# Patient Record
Sex: Male | Born: 1980 | Race: Black or African American | Hispanic: No | Marital: Single | Smoking: Current some day smoker
Health system: Southern US, Community
[De-identification: ages and names within clinical notes are randomized; demographics above are authoritative.]

## PROBLEM LIST (undated history)

## (undated) DIAGNOSIS — F1010 Alcohol abuse, uncomplicated: Secondary | ICD-10-CM

## (undated) DIAGNOSIS — F1410 Cocaine abuse, uncomplicated: Secondary | ICD-10-CM

## (undated) DIAGNOSIS — Z720 Tobacco use: Secondary | ICD-10-CM

## (undated) DIAGNOSIS — G5631 Lesion of radial nerve, right upper limb: Secondary | ICD-10-CM

## (undated) DIAGNOSIS — G2581 Restless legs syndrome: Secondary | ICD-10-CM

---

## 2007-02-15 ENCOUNTER — Emergency Department (HOSPITAL_COMMUNITY): Admission: EM | Admit: 2007-02-15 | Discharge: 2007-02-15 | Payer: Self-pay | Admitting: Emergency Medicine

## 2020-06-19 DIAGNOSIS — T300 Burn of unspecified body region, unspecified degree: Secondary | ICD-10-CM

## 2020-06-19 HISTORY — DX: Burn of unspecified body region, unspecified degree: T30.0

## 2020-07-28 ENCOUNTER — Emergency Department (HOSPITAL_COMMUNITY): Payer: Medicaid Other

## 2020-07-28 ENCOUNTER — Encounter (HOSPITAL_COMMUNITY): Payer: Self-pay | Admitting: *Deleted

## 2020-07-28 ENCOUNTER — Inpatient Hospital Stay (HOSPITAL_COMMUNITY)
Admission: EM | Admit: 2020-07-28 | Discharge: 2020-08-14 | DRG: 917 | Disposition: A | Discharge status: Home / Self Care | Payer: Medicaid Other | Attending: Internal Medicine | Admitting: Internal Medicine

## 2020-07-28 DIAGNOSIS — K7110 Toxic liver disease with hepatic necrosis, without coma: Secondary | ICD-10-CM | POA: Diagnosis present

## 2020-07-28 DIAGNOSIS — K449 Diaphragmatic hernia without obstruction or gangrene: Secondary | ICD-10-CM | POA: Diagnosis not present

## 2020-07-28 DIAGNOSIS — D6959 Other secondary thrombocytopenia: Secondary | ICD-10-CM | POA: Diagnosis present

## 2020-07-28 DIAGNOSIS — E869 Volume depletion, unspecified: Secondary | ICD-10-CM | POA: Diagnosis present

## 2020-07-28 DIAGNOSIS — N179 Acute kidney failure, unspecified: Secondary | ICD-10-CM

## 2020-07-28 DIAGNOSIS — K226 Gastro-esophageal laceration-hemorrhage syndrome: Secondary | ICD-10-CM | POA: Diagnosis present

## 2020-07-28 DIAGNOSIS — Z20822 Contact with and (suspected) exposure to covid-19: Secondary | ICD-10-CM | POA: Diagnosis present

## 2020-07-28 DIAGNOSIS — K2950 Unspecified chronic gastritis without bleeding: Secondary | ICD-10-CM | POA: Diagnosis present

## 2020-07-28 DIAGNOSIS — I10 Essential (primary) hypertension: Secondary | ICD-10-CM | POA: Diagnosis not present

## 2020-07-28 DIAGNOSIS — E876 Hypokalemia: Secondary | ICD-10-CM | POA: Diagnosis not present

## 2020-07-28 DIAGNOSIS — T8241XA Breakdown (mechanical) of vascular dialysis catheter, initial encounter: Secondary | ICD-10-CM

## 2020-07-28 DIAGNOSIS — J9601 Acute respiratory failure with hypoxia: Secondary | ICD-10-CM | POA: Diagnosis not present

## 2020-07-28 DIAGNOSIS — F1010 Alcohol abuse, uncomplicated: Secondary | ICD-10-CM | POA: Diagnosis not present

## 2020-07-28 DIAGNOSIS — D689 Coagulation defect, unspecified: Secondary | ICD-10-CM | POA: Diagnosis present

## 2020-07-28 DIAGNOSIS — T391X1A Poisoning by 4-Aminophenol derivatives, accidental (unintentional), initial encounter: Principal | ICD-10-CM

## 2020-07-28 DIAGNOSIS — R7989 Other specified abnormal findings of blood chemistry: Secondary | ICD-10-CM

## 2020-07-28 DIAGNOSIS — F1410 Cocaine abuse, uncomplicated: Secondary | ICD-10-CM | POA: Diagnosis present

## 2020-07-28 DIAGNOSIS — Z09 Encounter for follow-up examination after completed treatment for conditions other than malignant neoplasm: Secondary | ICD-10-CM

## 2020-07-28 DIAGNOSIS — K5900 Constipation, unspecified: Secondary | ICD-10-CM | POA: Diagnosis not present

## 2020-07-28 DIAGNOSIS — R109 Unspecified abdominal pain: Secondary | ICD-10-CM

## 2020-07-28 DIAGNOSIS — G5631 Lesion of radial nerve, right upper limb: Secondary | ICD-10-CM | POA: Diagnosis present

## 2020-07-28 DIAGNOSIS — E871 Hypo-osmolality and hyponatremia: Secondary | ICD-10-CM | POA: Diagnosis present

## 2020-07-28 DIAGNOSIS — B9681 Helicobacter pylori [H. pylori] as the cause of diseases classified elsewhere: Secondary | ICD-10-CM | POA: Diagnosis present

## 2020-07-28 DIAGNOSIS — G9341 Metabolic encephalopathy: Secondary | ICD-10-CM | POA: Diagnosis present

## 2020-07-28 DIAGNOSIS — F17210 Nicotine dependence, cigarettes, uncomplicated: Secondary | ICD-10-CM | POA: Diagnosis not present

## 2020-07-28 DIAGNOSIS — F1210 Cannabis abuse, uncomplicated: Secondary | ICD-10-CM | POA: Diagnosis present

## 2020-07-28 DIAGNOSIS — K760 Fatty (change of) liver, not elsewhere classified: Secondary | ICD-10-CM | POA: Diagnosis present

## 2020-07-28 DIAGNOSIS — K529 Noninfective gastroenteritis and colitis, unspecified: Secondary | ICD-10-CM

## 2020-07-28 DIAGNOSIS — K7040 Alcoholic hepatic failure without coma: Secondary | ICD-10-CM | POA: Diagnosis present

## 2020-07-28 DIAGNOSIS — L299 Pruritus, unspecified: Secondary | ICD-10-CM | POA: Diagnosis not present

## 2020-07-28 DIAGNOSIS — E875 Hyperkalemia: Secondary | ICD-10-CM | POA: Diagnosis not present

## 2020-07-28 DIAGNOSIS — K7290 Hepatic failure, unspecified without coma: Secondary | ICD-10-CM | POA: Diagnosis present

## 2020-07-28 DIAGNOSIS — D619 Aplastic anemia, unspecified: Secondary | ICD-10-CM | POA: Diagnosis present

## 2020-07-28 DIAGNOSIS — T24312D Burn of third degree of left thigh, subsequent encounter: Secondary | ICD-10-CM

## 2020-07-28 DIAGNOSIS — X12XXXD Contact with other hot fluids, subsequent encounter: Secondary | ICD-10-CM | POA: Diagnosis present

## 2020-07-28 DIAGNOSIS — T310 Burns involving less than 10% of body surface: Secondary | ICD-10-CM | POA: Diagnosis present

## 2020-07-28 DIAGNOSIS — Z87442 Personal history of urinary calculi: Secondary | ICD-10-CM

## 2020-07-28 DIAGNOSIS — N170 Acute kidney failure with tubular necrosis: Secondary | ICD-10-CM | POA: Diagnosis present

## 2020-07-28 DIAGNOSIS — E872 Acidosis: Secondary | ICD-10-CM | POA: Diagnosis present

## 2020-07-28 DIAGNOSIS — K7200 Acute and subacute hepatic failure without coma: Secondary | ICD-10-CM

## 2020-07-28 DIAGNOSIS — R0600 Dyspnea, unspecified: Secondary | ICD-10-CM

## 2020-07-28 DIAGNOSIS — R52 Pain, unspecified: Secondary | ICD-10-CM

## 2020-07-28 DIAGNOSIS — G2581 Restless legs syndrome: Secondary | ICD-10-CM | POA: Diagnosis present

## 2020-07-28 DIAGNOSIS — R791 Abnormal coagulation profile: Secondary | ICD-10-CM

## 2020-07-28 HISTORY — DX: Tobacco use: Z72.0

## 2020-07-28 HISTORY — DX: Lesion of radial nerve, right upper limb: G56.31

## 2020-07-28 HISTORY — DX: Cocaine abuse, uncomplicated: F14.10

## 2020-07-28 HISTORY — DX: Restless legs syndrome: G25.81

## 2020-07-28 HISTORY — DX: Alcohol abuse, uncomplicated: F10.10

## 2020-07-28 LAB — PROTIME-INR
INR: 2.5 — ABNORMAL HIGH (ref 0.8–1.2)
Prothrombin Time: 25.8 seconds — ABNORMAL HIGH (ref 11.4–15.2)

## 2020-07-28 LAB — SARS CORONAVIRUS 2 BY RT PCR (HOSPITAL ORDER, PERFORMED IN ~~LOC~~ HOSPITAL LAB): SARS Coronavirus 2: NEGATIVE

## 2020-07-28 LAB — COMPREHENSIVE METABOLIC PANEL
ALT: 8319 U/L — ABNORMAL HIGH (ref 0–44)
AST: >10000 U/L — ABNORMAL HIGH (ref 15–41)
Albumin: 3.8 g/dL (ref 3.5–5.0)
Alkaline Phosphatase: 198 U/L — ABNORMAL HIGH (ref 38–126)
Anion gap: 15 (ref 5–15)
BUN: 14 mg/dL (ref 6–20)
CO2: 21 mmol/L — ABNORMAL LOW (ref 22–32)
Calcium: 9 mg/dL (ref 8.9–10.3)
Chloride: 98 mmol/L (ref 98–111)
Creatinine, Ser: 2.26 mg/dL — ABNORMAL HIGH (ref 0.61–1.24)
GFR calc Af Amer: 41 mL/min — ABNORMAL LOW (ref >60)
GFR calc non Af Amer: 35 mL/min — ABNORMAL LOW (ref >60)
Glucose, Bld: 118 mg/dL — ABNORMAL HIGH (ref 70–99)
Potassium: 4.1 mmol/L (ref 3.5–5.1)
Sodium: 134 mmol/L — ABNORMAL LOW (ref 135–145)
Total Bilirubin: 5.6 mg/dL — ABNORMAL HIGH (ref 0.3–1.2)
Total Protein: 7.2 g/dL (ref 6.5–8.1)

## 2020-07-28 LAB — BLOOD GAS, ARTERIAL
Acid-base deficit: 3.1 mmol/L — ABNORMAL HIGH (ref 0.0–2.0)
Bicarbonate: 21.5 mmol/L (ref 20.0–28.0)
FIO2: 21
O2 Saturation: 94.9 %
Patient temperature: 37
pCO2 arterial: 42 mmHg (ref 32.0–48.0)
pH, Arterial: 7.335 — ABNORMAL LOW (ref 7.350–7.450)
pO2, Arterial: 87 mmHg (ref 83.0–108.0)

## 2020-07-28 LAB — URINALYSIS, ROUTINE W REFLEX MICROSCOPIC
Bilirubin Urine: NEGATIVE
Glucose, UA: NEGATIVE mg/dL
Ketones, ur: NEGATIVE mg/dL
Nitrite: NEGATIVE
Protein, ur: >=300 mg/dL — AB
Specific Gravity, Urine: 1.022 (ref 1.005–1.030)
pH: 5 (ref 5.0–8.0)

## 2020-07-28 LAB — CBC
HCT: 45.4 % (ref 39.0–52.0)
Hemoglobin: 15.3 g/dL (ref 13.0–17.0)
MCH: 31.7 pg (ref 26.0–34.0)
MCHC: 33.7 g/dL (ref 30.0–36.0)
MCV: 94.2 fL (ref 80.0–100.0)
Platelets: 95 10*3/uL — ABNORMAL LOW (ref 150–400)
RBC: 4.82 MIL/uL (ref 4.22–5.81)
RDW: 12.8 % (ref 11.5–15.5)
WBC: 9 10*3/uL (ref 4.0–10.5)
nRBC: 0 % (ref 0.0–0.2)

## 2020-07-28 LAB — RAPID URINE DRUG SCREEN, HOSP PERFORMED
Amphetamines: NOT DETECTED
Barbiturates: NOT DETECTED
Benzodiazepines: NOT DETECTED
Cocaine: NOT DETECTED
Opiates: NOT DETECTED
Tetrahydrocannabinol: NOT DETECTED

## 2020-07-28 LAB — LACTIC ACID, PLASMA
Lactic Acid, Venous: 2.9 mmol/L (ref 0.5–1.9)
Lactic Acid, Venous: 2.9 mmol/L (ref 0.5–1.9)

## 2020-07-28 LAB — AMMONIA: Ammonia: 40 umol/L — ABNORMAL HIGH (ref 9–35)

## 2020-07-28 LAB — ACETAMINOPHEN LEVEL: Acetaminophen (Tylenol), Serum: 27 ug/mL (ref 10–30)

## 2020-07-28 LAB — LIPASE, BLOOD: Lipase: 38 U/L (ref 11–51)

## 2020-07-28 LAB — ETHANOL: Alcohol, Ethyl (B): 36 mg/dL — ABNORMAL HIGH (ref <10)

## 2020-07-28 MED ORDER — HYDROMORPHONE HCL 1 MG/ML IJ SOLN
0.5000 mg | Freq: Once | INTRAMUSCULAR | Status: AC
  Administered 2020-07-28: 0.5 mg via INTRAVENOUS
  Filled 2020-07-28: qty 1

## 2020-07-28 MED ORDER — HYDROMORPHONE HCL 1 MG/ML IJ SOLN
1.0000 mg | Freq: Once | INTRAMUSCULAR | Status: AC
  Administered 2020-07-28: 1 mg via INTRAVENOUS
  Filled 2020-07-28: qty 1

## 2020-07-28 MED ORDER — VITAMIN K1 10 MG/ML IJ SOLN
10.0000 mg | Freq: Every day | INTRAMUSCULAR | Status: DC
  Administered 2020-07-29 – 2020-07-31 (×3): 10 mg via SUBCUTANEOUS
  Filled 2020-07-28 (×3): qty 1

## 2020-07-28 MED ORDER — ONDANSETRON HCL 4 MG/2ML IJ SOLN
4.0000 mg | Freq: Once | INTRAMUSCULAR | Status: AC
  Administered 2020-07-28: 4 mg via INTRAVENOUS
  Filled 2020-07-28: qty 2

## 2020-07-28 MED ORDER — SODIUM CHLORIDE 0.9 % IV BOLUS
1000.0000 mL | Freq: Once | INTRAVENOUS | Status: AC
  Administered 2020-07-28: 1000 mL via INTRAVENOUS

## 2020-07-28 MED ORDER — ACETYLCYSTEINE 200 MG/ML IV SOLN
INTRAVENOUS | Status: AC
  Filled 2020-07-28: qty 30

## 2020-07-28 MED ORDER — DEXTROSE 5 % IV SOLN
15.0000 mg/kg/h | INTRAVENOUS | Status: DC
  Administered 2020-07-28 – 2020-07-30 (×4): 15 mg/kg/h via INTRAVENOUS
  Filled 2020-07-28 (×5): qty 120

## 2020-07-28 MED ORDER — HYDROMORPHONE HCL 1 MG/ML IJ SOLN
0.5000 mg | INTRAMUSCULAR | Status: DC | PRN
  Administered 2020-07-29 – 2020-08-14 (×74): 0.5 mg via INTRAVENOUS
  Filled 2020-07-28 (×74): qty 1

## 2020-07-28 MED ORDER — SODIUM CHLORIDE 0.9% FLUSH
3.0000 mL | Freq: Once | INTRAVENOUS | Status: AC
  Administered 2020-07-28: 3 mL via INTRAVENOUS

## 2020-07-28 MED ORDER — SODIUM CHLORIDE 0.9 % IV SOLN: Freq: Once | INTRAVENOUS | Status: AC

## 2020-07-28 MED ORDER — HYDROMORPHONE HCL 1 MG/ML IJ SOLN: 1.0000 mg | Freq: Once | INTRAMUSCULAR | Status: DC

## 2020-07-28 MED ORDER — ACETYLCYSTEINE LOAD VIA INFUSION
150.0000 mg/kg | Freq: Once | INTRAVENOUS | Status: AC
  Administered 2020-07-28: 10755 mg via INTRAVENOUS
  Filled 2020-07-28: qty 269

## 2020-07-28 MED ORDER — ACETAMINOPHEN 325 MG PO TABS: 650.0000 mg | ORAL_TABLET | Freq: Once | ORAL | Status: DC

## 2020-07-28 NOTE — Progress Notes (Signed)
MEDICATION RELATED CONSULT NOTE - INITIAL   Pharmacy Consult for mucomist Indication: R/o acetaminophen overdose  Allergies  Allergen Reactions  . Bee Venom     Patient Measurements: Height: 6\' 1"  (185.4 cm) Weight: 71.7 kg (158 lb) IBW/kg (Calculated) : 79.9 Adjusted Body Weight:   Vital Signs: Temp: 97.6 F (36.4 C) (08/09 1207) Temp Source: Oral (08/09 1207) BP: 121/78 (08/09 1530) Pulse Rate: 87 (08/09 1530) Intake/Output from previous day: No intake/output data recorded. Intake/Output from this shift: Total I/O In: 2000 [IV Piggyback:2000] Out: -   Labs: Recent Labs    07/28/20 1336  WBC 9.0  HGB 15.3  HCT 45.4  PLT 95*  CREATININE 2.26*  ALBUMIN 3.8  PROT 7.2  AST >10,000*  ALT 8,319*  ALKPHOS 198*  BILITOT 5.6*   Estimated Creatinine Clearance: 44.1 mL/min (A) (by C-G formula based on SCr of 2.26 mg/dL (H)).   Microbiology: No results found for this or any previous visit (from the past 720 hour(s)).  Medical History: History reviewed. No pertinent past medical history.  Medications:  (Not in a hospital admission)  Scheduled:  . acetylcysteine  150 mg/kg Intravenous Once   Infusions:  . acetylcysteine      Assessment: Pt presented to the ED after 2 days of abdominal pain. His labs are suggesting for acute liver injury from possible acetaminophen overdose. Level is pending. Case has been reported to the [facility]. Acetylcysteine infusion is recommended.    ALT 8319 AST >10000 INR 2.5 Total bilirubin 5.6 Acetaminophen level >> pending  Plan:  Acetylcysteine 150mg /kg x1 Acetylcysteine 15mg /kg/hr infusion [place] will based on poison center  [facility], PharmD, BCIDP, AAHIVP, CPP Infectious Disease Pharmacist 07/28/2020 5:57 PM

## 2020-07-28 NOTE — ED Provider Notes (Signed)
[facility] EMERGENCY DEPARTMENT Provider Note   CSN: [id number] Arrival date & time: 07/28/20  1124     History Chief Complaint  Patient presents with  . Abdominal Pain  . Groin Pain    Paul Reese is a 39 y.o. male.  HPI      40 year old male presents with abdominal pain for the last 2 days.  Patient states he has had pain in the left lower quadrant radiating to the left testicle.  He states a history of kidney stones and states this does feel similar.  He had a vomiting earlier today which is since resolved.  He denies any associated fevers, chills, diarrhea, dysuria, hematuria, penile discharge.  He denies any testicular swelling or redness.  History reviewed. No pertinent past medical history.  There are no problems to display for this patient.   History reviewed. No pertinent surgical history.     History reviewed. No pertinent family history.  Social History   Tobacco Use  . Smoking status: Current Some Day Smoker  . Smokeless tobacco: Never Used  Substance Use Topics  . Alcohol use: Yes  . Drug use: Never    Home Medications Prior to Admission medications   Not on File    Allergies    Bee venom  Review of Systems   Review of Systems  Constitutional: Negative for chills and fever.  Respiratory: Negative for shortness of breath.   Cardiovascular: Negative for chest pain.  Gastrointestinal: Positive for abdominal pain, diarrhea, nausea and vomiting.  Genitourinary: Negative for flank pain and hematuria.       Testicular pain  All other systems reviewed and are negative.   Physical Exam Updated Vital Signs BP 121/78   Pulse 87   Temp 97.6 F (36.4 C) (Oral)   Resp 11   Ht 6\' 1"  (1.854 m)   Wt 71.7 kg   SpO2 98%   BMI 20.85 kg/m   Physical Exam Vitals and nursing note reviewed. Exam conducted with a chaperone present.  Constitutional:      Appearance: He is well-developed.  HENT:     Head: Normocephalic and atraumatic.  Eyes:       Conjunctiva/sclera: Conjunctivae normal.  Cardiovascular:     Rate and Rhythm: Normal rate and regular rhythm.     Heart sounds: Normal heart sounds. No murmur heard.   Pulmonary:     Effort: Pulmonary effort is normal. No respiratory distress.     Breath sounds: Normal breath sounds. No wheezing or rales.  Abdominal:     General: Bowel sounds are normal. There is no distension.     Palpations: Abdomen is soft.     Tenderness: There is abdominal tenderness in the left lower quadrant.  Genitourinary:    Pubic Area: No rash.      Penis: Normal and circumcised. No tenderness or discharge.      Testes: Normal.        Right: Tenderness not present.        Left: Tenderness not present.     Epididymis:     Right: Normal.     Left: Normal.  Musculoskeletal:        General: No tenderness or deformity. Normal range of motion.     Cervical back: Neck supple.  Skin:    General: Skin is warm and dry.     Findings: No erythema or rash.  Neurological:     Mental Status: He is alert and oriented to person, place, and  time.  Psychiatric:        Behavior: Behavior normal.     ED Results / Procedures / Treatments   Labs (all labs ordered are listed, but only abnormal results are displayed) Labs Reviewed  COMPREHENSIVE METABOLIC PANEL - Abnormal; Notable for the following components:      Result Value   Sodium 134 (*)    CO2 21 (*)    Glucose, Bld 118 (*)    Creatinine, Ser 2.26 (*)    AST >10,000 (*)    ALT 8,319 (*)    Alkaline Phosphatase 198 (*)    Total Bilirubin 5.6 (*)    GFR calc non Af Amer 35 (*)    GFR calc Af Amer 41 (*)    All other components within normal limits  CBC - Abnormal; Notable for the following components:   Platelets 95 (*)    All other components within normal limits  URINALYSIS, ROUTINE W REFLEX MICROSCOPIC - Abnormal; Notable for the following components:   Color, Urine AMBER (*)    APPearance CLOUDY (*)    Hgb urine dipstick MODERATE (*)     Protein, ur >=300 (*)    Leukocytes,Ua TRACE (*)    Bacteria, UA RARE (*)    All other components within normal limits  ETHANOL - Abnormal; Notable for the following components:   Alcohol, Ethyl (B) 36 (*)    All other components within normal limits  PROTIME-INR - Abnormal; Notable for the following components:   Prothrombin Time 25.8 (*)    INR 2.5 (*)    All other components within normal limits  SARS CORONAVIRUS 2 BY RT PCR (HOSPITAL ORDER, PERFORMED IN [facility])  LIPASE, BLOOD  HEPATITIS PANEL, ACUTE  ACETAMINOPHEN LEVEL  RAPID URINE DRUG SCREEN, HOSP PERFORMED  LACTIC ACID, PLASMA  LACTIC ACID, PLASMA    EKG None  Radiology CT RENAL STONE STUDY  Result Date: 07/28/2020 CLINICAL DATA:  Left-sided flank, left lower quadrant pain and nausea 2 days, history of renal stones, dark urine EXAM: CT ABDOMEN AND PELVIS WITHOUT CONTRAST TECHNIQUE: Multidetector CT imaging of the abdomen and pelvis was performed following the standard protocol without IV contrast. COMPARISON:  02/15/2007 FINDINGS: Lower chest: No acute abnormality. Hepatobiliary: No solid liver abnormality is seen. Hepatic steatosis. No gallstones, gallbladder wall thickening, or biliary dilatation. Pancreas: Unremarkable. No pancreatic ductal dilatation or surrounding inflammatory changes. Spleen: Normal in size without significant abnormality. Adrenals/Urinary Tract: Adrenal glands are unremarkable. Multiple small bilateral renal calculi. No hydronephrosis. Bladder is unremarkable. Stomach/Bowel: Stomach is within normal limits. Appendix appears normal. There is some suggestion of colonic wall thickening, particularly the transverse colon (series 5, image 28). Vascular/Lymphatic: No significant vascular findings are present. No enlarged abdominal or pelvic lymph nodes. Reproductive: No mass or other significant abnormality. Other: No abdominal wall hernia or abnormality. Small volume free fluid in the low pelvis.  Musculoskeletal: No acute or significant osseous findings. IMPRESSION: 1. There is some suggestion of colonic wall thickening, particularly the transverse colon, suggestive of nonspecific infectious or inflammatory colitis. Correlate with referable signs and symptoms. 2. Small volume nonspecific free fluid in the low pelvis, likely reactive. 3. Multiple small bilateral renal calculi without evidence of ureteral calculus or hydronephrosis. 4. Hepatic steatosis. Electronically Signed   By: Lauralyn PrimesAlex  Bibbey M.D.   On: 07/28/2020 14:55   US Abdomen Limited RUQ  Result Date: 07/28/2020 CLINICAL DATA:  Elevated liver function tests, abdominal pain EXAM: ULTRASOUND ABDOMEN LIMITED RIGHT UPPER QUADRANT COMPARISON:  None. FINDINGS: Gallbladder: The gallbladder is decompressed. No intraluminal stones or sludge is identified. The gallbladder wall, however, appears slightly thickened and edematous, particularly involving its hepatic segment. This is nonspecific, but can be seen in the setting of inflammatory conditions, such as cholangitis or hepatitis, or in the setting of hypoproteinemia/anasarca, though this is considered less likely given the lack of associated ascites. Common bile duct: Diameter: 5 mm in proximal diameter. The distal duct is obscured by overlying bowel gas. Liver: No focal lesion identified. Within normal limits in parenchymal echogenicity. Portal vein is patent on color Doppler imaging with normal direction of blood flow towards the liver. Other: No ascites is identified IMPRESSION: Edematous change of the gallbladder wall. This can be seen in the setting of underlying inflammation, as can be seen with hepatitis or cholangitis. Correlation with laboratory examination may be helpful for further evaluation. Electronically Signed   By: Helyn Numbers MD   On: 07/28/2020 16:48    Procedures .Critical Care Performed by: Clayborne Artist, PA-C Authorized by: Clayborne Artist, PA-C   Critical care  provider statement:    Critical care time (minutes):  45   Critical care was necessary to treat or prevent imminent or life-threatening deterioration of the following conditions:  Hepatic failure, endocrine crisis, dehydration and renal failure   Critical care was time spent personally by me on the following activities:  Discussions with consultants, evaluation of patient's response to treatment, examination of patient, ordering and performing treatments and interventions, ordering and review of laboratory studies, ordering and review of radiographic studies, pulse oximetry, re-evaluation of patient's condition, obtaining history from patient or surrogate and review of old charts   (including critical care time)  Medications Ordered in ED Medications  acetylcysteine (ACETADOTE) 40 mg/mL load via infusion 10,755 mg (has no administration in time range)  acetylcysteine (ACETADOTE) 24,000 mg in dextrose 5 % 600 mL (40 mg/mL) infusion (has no administration in time range)  sodium chloride flush (NS) 0.9 % injection 3 mL (3 mLs Intravenous Given 07/28/20 1445)  HYDROmorphone (DILAUDID) injection 1 mg (1 mg Intravenous Given 07/28/20 1450)  ondansetron (ZOFRAN) injection 4 mg (4 mg Intravenous Given 07/28/20 1449)  sodium chloride 0.9 % bolus 1,000 mL (0 mLs Intravenous Stopped 07/28/20 1742)  sodium chloride 0.9 % bolus 1,000 mL (0 mLs Intravenous Stopped 07/28/20 1743)    ED Course  I have reviewed the triage vital signs and the nursing notes.  Pertinent labs & imaging results that were available during my care of the patient were reviewed by me and considered in my medical decision making (see chart for details).    MDM Rules/Calculators/A&P                           Patient present with left lower quadrant abdominal pain radiating to the left testicle.  He did note some diarrhea over the several days and then vomiting today.  On initial evaluation patient tender in the left lower quadrant.  CBC  unremarkable.  Creatinine elevated at 2.26.  LFTs significantly elevated in the thousands and total bilirubin elevated at 5.6.  CT shows some possible colitis. Right upper quadrant ultrasound shows edematous changes however no gallbladder stones.  History and physical is not consistent with acute cholecystitis or cholangitis given no right upper quadrant abdominal pain and normal lipase. Discussed with patient and he is a daily drinker approximately a sixpack a day.  He also admits to taking  approximately 12 500 mg tablets of Tylenol yesterday between the hours of 3 PM and 10 PM.  EtOH level elevated at 36.  INR elevated at 2.5.  Tylenol level pending.  1830: Discussed with poison control who recommends initiating NAC IV.  Recommended a 150 mg/kg bolus over 1 hour, continuous cardiac monitoring.  Then the patient can be placed on 15 mix per cake per hour for 23 hours.  Orders have been placed.  1918: Discussed with critical care, Dr. Arsenio Loader.  He accepted the patient to the wait list under Dr. Craige Cotta.  However he did request that we speak directly to a liver transplant team to see if the patient would be eligible for transplant.  If the patient is eligible for transplant he likely needs to be transferred elsewhere.  Per Dr. Arsenio Loader if the patient is not eligible for a transplant, he can stay within the consistent.  1948: Spoke with hepatologist at [facility], Dr. Waynetta Sandy.  He stated patient is not a transplant candidate.  He did recommend adding on an ABG to trend.  He stated that based on the patient's INR and creatinine at this time he felt he was going to respond well to NAC.  He states he is happy to consult in the future if necessary.  No beds available at [facility].  Temporary admit orders placed.  As needed pain medicine placed.  Will hold on antibiotics for colitis at this time given only transverse colon involved.  NAC has been ordered and initiated.  Diet order placed for the patient.     Final Clinical  Impression(s) / ED Diagnoses Final diagnoses:  Pain    Rx / DC Orders ED Discharge Orders    None       Rueben Bash 07/28/20 2025    Vanetta Mulders, MD 07/28/20 2059

## 2020-07-28 NOTE — ED Notes (Signed)
Date and time results received: 07/28/20 1817   Test: LA Critical Value: 2.9  Name of Provider Notified: PA Kendrick  Orders Received? Or Actions Taken?: n/a

## 2020-07-28 NOTE — ED Triage Notes (Signed)
Abdominal pain and groin pain for the past 2 days

## 2020-07-28 NOTE — ED Provider Notes (Signed)
EKG Interpretation  Date/Time:  Monday July 28 2020 23:51:51 EDT Ventricular Rate:  122 PR Interval:    QRS Duration: 94 QT Interval:  318 QTC Calculation: 453 R Axis:   71 Text Interpretation: Sinus tachycardia Ventricular premature complex Consider left ventricular hypertrophy Artifact in lead(s) I II aVR aVL aVF V3 V4 V5 V6 Interpretation limited secondary to artifact No previous ECGs available Confirmed by Zadie Rhine [phone]) on 07/28/2020 11:55:36 PM      Patient awaiting admission for liver failure and presumed APAP toxicity No acute distress at this time. I have confirmed that the N-acetylcysteine has been scheduled for the next 24 hours    Zadie Rhine, MD 07/28/20 2358

## 2020-07-28 NOTE — ED Notes (Signed)
Spoke with [employer]. Recommends 10mg  Vit. K daily when INR >2.0. Requests repeat tylenol and LFTs at 1800 on 07/29/2020

## 2020-07-28 NOTE — ED Notes (Signed)
AC called for acetylcysteine

## 2020-07-28 NOTE — ED Notes (Signed)
Date and time results received: 07/28/20 2031 (use smartphrase ".now" to insert current time)  Test: Lactic Acid Critical Value: 2.9  Name of Provider Notified: Birdie Riddle, [place]  Orders Received? Or Actions Taken?:

## 2020-07-29 ENCOUNTER — Encounter (HOSPITAL_COMMUNITY): Payer: Self-pay | Admitting: Pulmonary Disease

## 2020-07-29 ENCOUNTER — Inpatient Hospital Stay (HOSPITAL_COMMUNITY): Payer: Medicaid Other

## 2020-07-29 ENCOUNTER — Other Ambulatory Visit: Payer: Self-pay

## 2020-07-29 DIAGNOSIS — N179 Acute kidney failure, unspecified: Secondary | ICD-10-CM

## 2020-07-29 DIAGNOSIS — F1010 Alcohol abuse, uncomplicated: Secondary | ICD-10-CM

## 2020-07-29 DIAGNOSIS — T391X1A Poisoning by 4-Aminophenol derivatives, accidental (unintentional), initial encounter: Secondary | ICD-10-CM

## 2020-07-29 DIAGNOSIS — R791 Abnormal coagulation profile: Secondary | ICD-10-CM

## 2020-07-29 DIAGNOSIS — K529 Noninfective gastroenteritis and colitis, unspecified: Secondary | ICD-10-CM

## 2020-07-29 DIAGNOSIS — K7290 Hepatic failure, unspecified without coma: Secondary | ICD-10-CM

## 2020-07-29 DIAGNOSIS — K7200 Acute and subacute hepatic failure without coma: Secondary | ICD-10-CM

## 2020-07-29 LAB — COMPREHENSIVE METABOLIC PANEL
ALT: 6756 U/L — ABNORMAL HIGH (ref 0–44)
AST: >10000 U/L — ABNORMAL HIGH (ref 15–41)
Albumin: 2.9 g/dL — ABNORMAL LOW (ref 3.5–5.0)
Alkaline Phosphatase: 177 U/L — ABNORMAL HIGH (ref 38–126)
Anion gap: 20 — ABNORMAL HIGH (ref 5–15)
BUN: 22 mg/dL — ABNORMAL HIGH (ref 6–20)
CO2: 18 mmol/L — ABNORMAL LOW (ref 22–32)
Calcium: 8.3 mg/dL — ABNORMAL LOW (ref 8.9–10.3)
Chloride: 100 mmol/L (ref 98–111)
Creatinine, Ser: 4.75 mg/dL — ABNORMAL HIGH (ref 0.61–1.24)
GFR calc Af Amer: 17 mL/min — ABNORMAL LOW (ref >60)
GFR calc non Af Amer: 14 mL/min — ABNORMAL LOW (ref >60)
Glucose, Bld: 80 mg/dL (ref 70–99)
Potassium: 6.2 mmol/L — ABNORMAL HIGH (ref 3.5–5.1)
Sodium: 138 mmol/L (ref 135–145)
Total Bilirubin: 6.9 mg/dL — ABNORMAL HIGH (ref 0.3–1.2)
Total Protein: 5.7 g/dL — ABNORMAL LOW (ref 6.5–8.1)

## 2020-07-29 LAB — HEPATITIS PANEL, ACUTE
HCV Ab: NONREACTIVE
Hep A IgM: NONREACTIVE
Hep B C IgM: NONREACTIVE
Hepatitis B Surface Ag: NONREACTIVE

## 2020-07-29 LAB — BASIC METABOLIC PANEL
Anion gap: 18 — ABNORMAL HIGH (ref 5–15)
BUN: 29 mg/dL — ABNORMAL HIGH (ref 6–20)
CO2: 23 mmol/L (ref 22–32)
Calcium: 7.9 mg/dL — ABNORMAL LOW (ref 8.9–10.3)
Chloride: 97 mmol/L — ABNORMAL LOW (ref 98–111)
Creatinine, Ser: 5.84 mg/dL — ABNORMAL HIGH (ref 0.61–1.24)
GFR calc Af Amer: 13 mL/min — ABNORMAL LOW (ref >60)
GFR calc non Af Amer: 11 mL/min — ABNORMAL LOW (ref >60)
Glucose, Bld: 103 mg/dL — ABNORMAL HIGH (ref 70–99)
Potassium: 4.1 mmol/L (ref 3.5–5.1)
Sodium: 138 mmol/L (ref 135–145)

## 2020-07-29 LAB — CREATININE, URINE, RANDOM: Creatinine, Urine: 150.17 mg/dL

## 2020-07-29 LAB — HIV ANTIBODY (ROUTINE TESTING W REFLEX): HIV Screen 4th Generation wRfx: NONREACTIVE

## 2020-07-29 LAB — CK: Total CK: 209 U/L (ref 49–397)

## 2020-07-29 LAB — HEPATIC FUNCTION PANEL
ALT: 4580 U/L — ABNORMAL HIGH (ref 0–44)
AST: >10000 U/L — ABNORMAL HIGH (ref 15–41)
Albumin: 2.5 g/dL — ABNORMAL LOW (ref 3.5–5.0)
Alkaline Phosphatase: 148 U/L — ABNORMAL HIGH (ref 38–126)
Bilirubin, Direct: 4.3 mg/dL — ABNORMAL HIGH (ref 0.0–0.2)
Indirect Bilirubin: 2.2 mg/dL — ABNORMAL HIGH (ref 0.3–0.9)
Total Bilirubin: 6.5 mg/dL — ABNORMAL HIGH (ref 0.3–1.2)
Total Protein: 4.6 g/dL — ABNORMAL LOW (ref 6.5–8.1)

## 2020-07-29 LAB — SODIUM, URINE, RANDOM: Sodium, Ur: 59 mmol/L

## 2020-07-29 LAB — MRSA PCR SCREENING: MRSA by PCR: POSITIVE — AB

## 2020-07-29 LAB — GLUCOSE, CAPILLARY: Glucose-Capillary: 72 mg/dL (ref 70–99)

## 2020-07-29 LAB — ACETAMINOPHEN LEVEL
Acetaminophen (Tylenol), Serum: 14 ug/mL (ref 10–30)
Acetaminophen (Tylenol), Serum: 13 ug/mL (ref 10–30)

## 2020-07-29 LAB — PROTIME-INR
INR: 2.8 — ABNORMAL HIGH (ref 0.8–1.2)
Prothrombin Time: 28.5 seconds — ABNORMAL HIGH (ref 11.4–15.2)

## 2020-07-29 MED ORDER — SODIUM CHLORIDE 0.9 % IV SOLN: INTRAVENOUS | Status: DC | PRN

## 2020-07-29 MED ORDER — SODIUM BICARBONATE 8.4 % IV SOLN
50.0000 meq | Freq: Once | INTRAVENOUS | Status: AC
  Administered 2020-07-29: 50 meq via INTRAVENOUS
  Filled 2020-07-29: qty 50

## 2020-07-29 MED ORDER — ONDANSETRON HCL 4 MG/2ML IJ SOLN
4.0000 mg | Freq: Four times a day (QID) | INTRAMUSCULAR | Status: DC | PRN
  Administered 2020-07-30 – 2020-08-01 (×7): 4 mg via INTRAVENOUS
  Filled 2020-07-29 (×8): qty 2

## 2020-07-29 MED ORDER — SODIUM ZIRCONIUM CYCLOSILICATE 10 G PO PACK
10.0000 g | PACK | Freq: Once | ORAL | Status: DC
  Filled 2020-07-29: qty 1

## 2020-07-29 MED ORDER — CHLORHEXIDINE GLUCONATE CLOTH 2 % EX PADS
6.0000 | MEDICATED_PAD | Freq: Every day | CUTANEOUS | Status: AC
  Administered 2020-07-30 – 2020-08-03 (×4): 6 via TOPICAL

## 2020-07-29 MED ORDER — SODIUM ZIRCONIUM CYCLOSILICATE 5 G PO PACK
10.0000 g | PACK | Freq: Once | ORAL | Status: AC
  Administered 2020-07-29: 10 g via ORAL
  Filled 2020-07-29: qty 2

## 2020-07-29 MED ORDER — SODIUM CHLORIDE 0.9 % IV SOLN
10.0000 mg/kg | Freq: Two times a day (BID) | INTRAVENOUS | Status: DC
  Filled 2020-07-29: qty 0.72

## 2020-07-29 MED ORDER — DEXTROSE 50 % IV SOLN
1.0000 | Freq: Once | INTRAVENOUS | Status: AC
  Administered 2020-07-29: 50 mL via INTRAVENOUS
  Filled 2020-07-29: qty 50

## 2020-07-29 MED ORDER — SODIUM CHLORIDE 0.9 % IV SOLN
15.0000 mg/kg | Freq: Once | INTRAVENOUS | Status: AC
  Administered 2020-07-29: 1080 mg via INTRAVENOUS
  Filled 2020-07-29: qty 1.08

## 2020-07-29 MED ORDER — INSULIN ASPART 100 UNIT/ML IV SOLN
10.0000 [IU] | Freq: Once | INTRAVENOUS | Status: AC
  Administered 2020-07-29: 10 [IU] via INTRAVENOUS

## 2020-07-29 MED ORDER — PANTOPRAZOLE SODIUM 40 MG PO TBEC
40.0000 mg | DELAYED_RELEASE_TABLET | Freq: Every day | ORAL | Status: DC
  Administered 2020-07-29 – 2020-07-30 (×2): 40 mg via ORAL
  Filled 2020-07-29 (×2): qty 1

## 2020-07-29 MED ORDER — SODIUM ZIRCONIUM CYCLOSILICATE 10 G PO PACK
10.0000 g | PACK | Freq: Three times a day (TID) | ORAL | Status: DC
  Administered 2020-07-29: 10 g via ORAL
  Filled 2020-07-29 (×2): qty 1

## 2020-07-29 MED ORDER — CHLORHEXIDINE GLUCONATE CLOTH 2 % EX PADS
6.0000 | MEDICATED_PAD | Freq: Every day | CUTANEOUS | Status: DC
  Administered 2020-07-29 – 2020-07-30 (×2): 6 via TOPICAL

## 2020-07-29 MED ORDER — CALCIUM GLUCONATE-NACL 1-0.675 GM/50ML-% IV SOLN
1.0000 g | Freq: Once | INTRAVENOUS | Status: AC
  Administered 2020-07-29: 1000 mg via INTRAVENOUS
  Filled 2020-07-29: qty 50

## 2020-07-29 MED ORDER — SODIUM CHLORIDE 0.9 % IV BOLUS
2000.0000 mL | Freq: Once | INTRAVENOUS | Status: AC
  Administered 2020-07-29: 2000 mL via INTRAVENOUS

## 2020-07-29 MED ORDER — DOCUSATE SODIUM 100 MG PO CAPS: 100.0000 mg | ORAL_CAPSULE | Freq: Two times a day (BID) | ORAL | Status: DC | PRN

## 2020-07-29 MED ORDER — SODIUM CHLORIDE 0.9 % IV BOLUS
500.0000 mL | Freq: Once | INTRAVENOUS | Status: AC
  Administered 2020-07-29: 500 mL via INTRAVENOUS

## 2020-07-29 MED ORDER — DEXTROSE-NACL 5-0.45 % IV SOLN: INTRAVENOUS | Status: DC

## 2020-07-29 MED ORDER — MUPIROCIN 2 % EX OINT
1.0000 "application " | TOPICAL_OINTMENT | Freq: Two times a day (BID) | CUTANEOUS | Status: AC
  Administered 2020-07-29 – 2020-08-02 (×10): 1 via NASAL
  Filled 2020-07-29: qty 22

## 2020-07-29 MED ORDER — METOCLOPRAMIDE HCL 5 MG/ML IJ SOLN
10.0000 mg | Freq: Once | INTRAMUSCULAR | Status: AC
  Administered 2020-07-29: 10 mg via INTRAVENOUS
  Filled 2020-07-29: qty 2

## 2020-07-29 MED ORDER — POLYETHYLENE GLYCOL 3350 17 G PO PACK: 17.0000 g | PACK | Freq: Every day | ORAL | Status: DC | PRN

## 2020-07-29 MED ORDER — ALBUTEROL SULFATE (2.5 MG/3ML) 0.083% IN NEBU
5.0000 mg | INHALATION_SOLUTION | Freq: Once | RESPIRATORY_TRACT | Status: AC
  Administered 2020-07-29: 5 mg via RESPIRATORY_TRACT
  Filled 2020-07-29: qty 6
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
## 2020-07-29 NOTE — ED Notes (Signed)
[facility] called and notified of bed ready status at [facility].
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
## 2020-07-29 NOTE — Progress Notes (Signed)
eLink Physician-Brief Progress Note Patient Name: Paul Reese DOB: 02-21-1981 MRN: [id number]   Date of Service  07/29/2020  HPI/Events of Note  Hiccups -  QTc from ECG on 07/28/2020 = 0.453 seconds.   eICU Interventions  Plan: 1. Reglan 10 mg IV X 1 now.      Intervention Category Major Interventions: Other:  Paul Reese 07/29/2020, 11:12 PM

## 2020-07-29 NOTE — ED Provider Notes (Signed)
Signout from Dr. Bebe Shaggy.  39 year old male here with acute liver failure and acute renal failure possibly due to acetaminophen overdose.  A.m. labs showing worsening renal function.  Patient has been persistently tachycardic.  He is otherwise awake and alert.  IV fluids and N-acetylcysteine infusing.  Awaiting ICU bed at [facility]. Physical Exam  BP 118/64   Pulse (!) 103   Temp 97.6 F (36.4 C) (Oral)   Resp (!) 22   Ht 6\' 1"  (1.854 m)   Wt 71.7 kg   SpO2 99%   BMI 20.85 kg/m   Physical Exam Patient is awake and alert.  Mildly tachycardic.  Blood pressure okay.  99% on room air.  Complaining of some nausea and abdominal pain.  Continuing with IV fluids and Mucomyst. ED Course/Procedures     .Critical Care Performed by: , MD Authorized by: Terrilee Files, MD   Critical care provider statement:    Critical care time (minutes):  30   Critical care time was exclusive of:  Separately billable procedures and treating other patients   Critical care was necessary to treat or prevent imminent or life-threatening deterioration of the following conditions:  Renal failure and hepatic failure   Critical care was time spent personally by me on the following activities:  Discussions with consultants, evaluation of patient's response to treatment, examination of patient, ordering and performing treatments and interventions, ordering and review of laboratory studies, pulse oximetry, re-evaluation of patient's condition, obtaining history from patient or surrogate and review of old charts    MDM  Morning labs showing potassium elevated at 6.2.  Repeat EKG shows no significant changes.  Lokelma ordered.  Critical care updated and will reevaluate patient.  Patient seen by critical care and are recommending Foley.  Okay with Lokelma.  Still awaiting ICU bed at [facility]  Discussed with [facility]. [facility] nephrology who will evaluate the patient in the ED.  [facility] called stating they  will likely have a bed at [facility] and asked if that would be an appropriate location for the patient to go.  I discussed with critical care here Dr. [facility] who said that would be fine.         Sherene Sires, MD 07/29/20 [phone]

## 2020-07-29 NOTE — Progress Notes (Signed)
Patient having really bad hiccups w/pain and requesting something to help with them. Paged M. Katherina Right. Will continue to monitor.

## 2020-07-29 NOTE — ED Notes (Signed)
Pt given water per RN approval. 
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
## 2020-07-30 DIAGNOSIS — F1010 Alcohol abuse, uncomplicated: Secondary | ICD-10-CM

## 2020-07-30 LAB — HEPATIC FUNCTION PANEL
ALT: 3448 U/L — ABNORMAL HIGH (ref 0–44)
AST: 8024 U/L — ABNORMAL HIGH (ref 15–41)
Albumin: 2.4 g/dL — ABNORMAL LOW (ref 3.5–5.0)
Alkaline Phosphatase: 146 U/L — ABNORMAL HIGH (ref 38–126)
Bilirubin, Direct: 5.3 mg/dL — ABNORMAL HIGH (ref 0.0–0.2)
Indirect Bilirubin: 2.4 mg/dL — ABNORMAL HIGH (ref 0.3–0.9)
Total Bilirubin: 7.7 mg/dL — ABNORMAL HIGH (ref 0.3–1.2)
Total Protein: 4.8 g/dL — ABNORMAL LOW (ref 6.5–8.1)

## 2020-07-30 LAB — COMPREHENSIVE METABOLIC PANEL
ALT: 3679 U/L — ABNORMAL HIGH (ref 0–44)
ALT: 3353 U/L — ABNORMAL HIGH (ref 0–44)
AST: >10000 U/L — ABNORMAL HIGH (ref 15–41)
AST: 5989 U/L — ABNORMAL HIGH (ref 15–41)
Albumin: 2.3 g/dL — ABNORMAL LOW (ref 3.5–5.0)
Albumin: 2.4 g/dL — ABNORMAL LOW (ref 3.5–5.0)
Alkaline Phosphatase: 141 U/L — ABNORMAL HIGH (ref 38–126)
Alkaline Phosphatase: 144 U/L — ABNORMAL HIGH (ref 38–126)
Anion gap: 14 (ref 5–15)
Anion gap: 19 — ABNORMAL HIGH (ref 5–15)
BUN: 31 mg/dL — ABNORMAL HIGH (ref 6–20)
BUN: 37 mg/dL — ABNORMAL HIGH (ref 6–20)
CO2: 21 mmol/L — ABNORMAL LOW (ref 22–32)
CO2: 19 mmol/L — ABNORMAL LOW (ref 22–32)
Calcium: 7.4 mg/dL — ABNORMAL LOW (ref 8.9–10.3)
Calcium: 8.1 mg/dL — ABNORMAL LOW (ref 8.9–10.3)
Chloride: 101 mmol/L (ref 98–111)
Chloride: 98 mmol/L (ref 98–111)
Creatinine, Ser: 6.51 mg/dL — ABNORMAL HIGH (ref 0.61–1.24)
Creatinine, Ser: 7.81 mg/dL — ABNORMAL HIGH (ref 0.61–1.24)
GFR calc Af Amer: 11 mL/min — ABNORMAL LOW (ref >60)
GFR calc Af Amer: 9 mL/min — ABNORMAL LOW (ref >60)
GFR calc non Af Amer: 10 mL/min — ABNORMAL LOW (ref >60)
GFR calc non Af Amer: 8 mL/min — ABNORMAL LOW (ref >60)
Glucose, Bld: 128 mg/dL — ABNORMAL HIGH (ref 70–99)
Glucose, Bld: 99 mg/dL (ref 70–99)
Potassium: 4 mmol/L (ref 3.5–5.1)
Potassium: 4 mmol/L (ref 3.5–5.1)
Sodium: 136 mmol/L (ref 135–145)
Sodium: 136 mmol/L (ref 135–145)
Total Bilirubin: 6.7 mg/dL — ABNORMAL HIGH (ref 0.3–1.2)
Total Bilirubin: 8.7 mg/dL — ABNORMAL HIGH (ref 0.3–1.2)
Total Protein: 4.5 g/dL — ABNORMAL LOW (ref 6.5–8.1)
Total Protein: 4.7 g/dL — ABNORMAL LOW (ref 6.5–8.1)

## 2020-07-30 LAB — CBC
HCT: 29.9 % — ABNORMAL LOW (ref 39.0–52.0)
Hemoglobin: 10.1 g/dL — ABNORMAL LOW (ref 13.0–17.0)
MCH: 32 pg (ref 26.0–34.0)
MCHC: 33.8 g/dL (ref 30.0–36.0)
MCV: 94.6 fL (ref 80.0–100.0)
Platelets: 85 10*3/uL — ABNORMAL LOW (ref 150–400)
RBC: 3.16 MIL/uL — ABNORMAL LOW (ref 4.22–5.81)
RDW: 12.8 % (ref 11.5–15.5)
WBC: 5.5 10*3/uL (ref 4.0–10.5)
nRBC: 0 % (ref 0.0–0.2)

## 2020-07-30 LAB — VOLATILES,BLD-ACETONE,ETHANOL,ISOPROP,METHANOL
Acetone, blood: <0.01 g/dL (ref 0.000–0.010)
Ethanol, blood: <0.01 g/dL (ref 0.000–0.010)
Isopropanol, blood: <0.01 g/dL (ref 0.000–0.010)
Methanol, blood: <0.01 g/dL (ref 0.000–0.010)

## 2020-07-30 LAB — IGG: IgG (Immunoglobin G), Serum: 801 mg/dL (ref 603–1613)

## 2020-07-30 LAB — MAGNESIUM: Magnesium: 1.9 mg/dL (ref 1.7–2.4)

## 2020-07-30 LAB — AMMONIA: Ammonia: 62 umol/L — ABNORMAL HIGH (ref 9–35)

## 2020-07-30 LAB — CMV IGM: CMV IgM: <30 AU/mL (ref 0.0–29.9)

## 2020-07-30 LAB — CERULOPLASMIN: Ceruloplasmin: 14.5 mg/dL — ABNORMAL LOW (ref 16.0–31.0)

## 2020-07-30 LAB — ETHYLENE GLYCOL: Ethylene Glycol Lvl: <5 mg/dL

## 2020-07-30 LAB — ANTI-SMOOTH MUSCLE ANTIBODY, IGG: F-Actin IgG: 9 Units (ref 0–19)

## 2020-07-30 LAB — CMV ANTIBODY, IGG (EIA): CMV Ab - IgG: >10 U/mL — ABNORMAL HIGH (ref 0.00–0.59)

## 2020-07-30 LAB — MITOCHONDRIAL ANTIBODIES: Mitochondrial M2 Ab, IgG: <20 Units (ref 0.0–20.0)

## 2020-07-30 LAB — PROTIME-INR
INR: 2.3 — ABNORMAL HIGH (ref 0.8–1.2)
INR: 1.6 — ABNORMAL HIGH (ref 0.8–1.2)
Prothrombin Time: 24.2 seconds — ABNORMAL HIGH (ref 11.4–15.2)
Prothrombin Time: 18.6 seconds — ABNORMAL HIGH (ref 11.4–15.2)

## 2020-07-30 LAB — ACETAMINOPHEN LEVEL: Acetaminophen (Tylenol), Serum: <10 ug/mL — ABNORMAL LOW (ref 10–30)

## 2020-07-30 LAB — PHOSPHORUS: Phosphorus: 3.6 mg/dL (ref 2.5–4.6)

## 2020-07-30 LAB — EPSTEIN-BARR VIRUS VCA, IGM: EBV VCA IgM: <36 U/mL (ref 0.0–35.9)

## 2020-07-30 MED ORDER — LABETALOL HCL 5 MG/ML IV SOLN
10.0000 mg | INTRAVENOUS | Status: DC | PRN
  Administered 2020-07-30 – 2020-07-31 (×4): 10 mg via INTRAVENOUS
  Filled 2020-07-30 (×6): qty 4

## 2020-07-30 MED ORDER — CALCIUM CARBONATE ANTACID 500 MG PO CHEW
1.0000 | CHEWABLE_TABLET | Freq: Two times a day (BID) | ORAL | Status: DC | PRN
  Administered 2020-08-01 – 2020-08-13 (×8): 200 mg via ORAL
  Filled 2020-07-30 (×9): qty 1

## 2020-07-30 MED ORDER — HYDROCORTISONE 1 % EX OINT
TOPICAL_OINTMENT | CUTANEOUS | Status: DC | PRN
  Filled 2020-07-30: qty 28.35

## 2020-07-30 MED ORDER — METOCLOPRAMIDE HCL 5 MG/ML IJ SOLN
10.0000 mg | Freq: Once | INTRAMUSCULAR | Status: AC
  Administered 2020-07-30: 10 mg via INTRAVENOUS
  Filled 2020-07-30: qty 2

## 2020-07-30 MED ORDER — HYDROCORTISONE 1 % EX OINT
TOPICAL_OINTMENT | CUTANEOUS | Status: DC | PRN
  Filled 2020-07-30: qty 28

## 2020-07-30 MED ORDER — PANTOPRAZOLE SODIUM 40 MG IV SOLR
40.0000 mg | INTRAVENOUS | Status: DC
  Administered 2020-07-31 – 2020-08-01 (×2): 40 mg via INTRAVENOUS
  Filled 2020-07-30 (×2): qty 40

## 2020-07-30 MED ORDER — LORAZEPAM 2 MG/ML IJ SOLN
0.5000 mg | INTRAMUSCULAR | Status: DC | PRN
  Administered 2020-07-31 – 2020-08-01 (×2): 0.5 mg via INTRAVENOUS
  Filled 2020-07-30 (×2): qty 1
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
## 2020-07-30 NOTE — Progress Notes (Signed)
Progress Note  CC:   Elevated liver tests       ASSESSMENT AND PLAN:    # Acute liver injury in setting of Etoh abuse , APAP and possible element of shock liver.  -[facility] ED contacted Dr. Waynetta Sandy, Hepatologist at [facility], who said patient not transplant candidate. --Most recent APAP level improved 27 >> 13.  --MELD 37 compared to 32 yesterday but renal function is worse  --Enzymes improved overnight. AST down from > 10K to 8K. Minimal improvement in ALT >> 3448. Bilirubin still rising 6.7>> 7.7 ( not unexpected). INR improving 2.8 >> 2.3.  --Acute viral hep panel negative. CMV IgM negative. EBV pending. ASMA, AMA pending. pending. Ceruloplasmin low at 14.5 ( normal 16-31).  --CK is normal.  --Glucose 128 --Hepatic steatosis, no acute findings on non-contrast CT scan.  --Continue acetylcysteine --Daily INR --Renal is following.  --Continue daily subq Vitamin K (INR improving) --Elevated BP? Need CIWA protocol? Drinks 6-10 beers / day. I spoke with PCCM, they will add something prn, wants to avoid Benzos.   # Nausea and vomiting of black emesis  --Patient reports vomiting black emesis at [facility] and once here yesterday. Nauseated but no vomiting this am. No BMs / melena. Hgb down 5 grams to 10.1 but suspect that is from IVF.  --am CBC --Given potential for vomiting will change PPI to IV -Continue prn Zofran  # Acute renal failure --Cr 4.7 >> 6.5 today --Nephrology following  # Possible colitis on CT scan.  --Evaluation after acute liver issues resolve --He gets diarrhea sometimes depending on diet. Has seen blood in stool in past, nothing recent. No diarrhea at present.        SUBJECTIVE   Doesn't feel well, other than nausea he doesn't have a specific complaint.     OBJECTIVE:     Vital signs in last 24 hours: Temp:  [97.5 F (36.4 C)-98.4 F (36.9 C)] 98.4 F (36.9 C) (08/11 0800) Pulse Rate:  [87-100] 87 (08/11 1100) Resp:  [14-24] 16 (08/11 1100) BP:  (143-158)/(89-104) 150/104 (08/11 1100) SpO2:  [88 %-95 %] 92 % (08/11 1100) Weight:  [77.6 kg] 77.6 kg (08/11 0500) Last BM Date: 07/29/20 General:   Alert, in NAD Heart:  Regular rate and rhythm.  No lower extremity edema   Pulm: Normal respiratory effort   Abdomen:  Soft,  Mildly distended, nontender.  Normal bowel sounds.          Neurologic:  Alert and  oriented,  grossly normal neurologically. No asterixis Psych:  Pleasant, cooperative.  Normal mood and affect.   Intake/Output from previous day: 08/10 0701 - 08/11 0700 In: 3542 [P.O.:60; I.V.:2711.6; IV Piggyback:770.4] Out: 1000 [Urine:300; Emesis/NG output:700] Intake/Output this shift: Total I/O In: 701.7 [I.V.:701.7] Out: -   Lab Results: Recent Labs    07/28/20 1336 07/30/20 0154  WBC 9.0 5.5  HGB 15.3 10.1*  HCT 45.4 29.9*  PLT 95* 85*   BMET Recent Labs    07/29/20 0658 07/29/20 1719 07/30/20 0154  NA 138 138 136  K 6.2* 4.1 4.0  CL 100 97* 101  CO2 18* 23 21*  GLUCOSE 80 103* 128*  BUN 22* 29* 31*  CREATININE 4.75* 5.84* 6.51*  CALCIUM 8.3* 7.9* 7.4*   LFT Recent Labs    07/30/20 1037  PROT 4.8*  ALBUMIN 2.4*  AST 8,024*  ALT 3,448*  ALKPHOS 146*  BILITOT 7.7*  BILIDIR 5.3*  IBILI 2.4*   PT/INR Recent Labs  07/29/20 1719 07/30/20 0154  LABPROT 28.5* 24.2*  INR 2.8* 2.3*   Hepatitis Panel Recent Labs    07/28/20 1336  HEPBSAG NON REACTIVE  HCVAB NON REACTIVE  HEPAIGM NON REACTIVE  HEPBIGM NON REACTIVE    US RENAL  Result Date: 07/29/2020 CLINICAL DATA:  39 year old male with acute renal insufficiency. EXAM: RENAL / URINARY TRACT ULTRASOUND COMPLETE COMPARISON:  CT abdomen pelvis dated 07/28/2020. FINDINGS: Right Kidney: Renal measurements: 12.4 x 4.5 x 5.5 cm = volume: 155 mL. There is diffuse increased renal parenchymal echogenicity. No hydronephrosis or shadowing stone. Trace perinephric free fluid. Left Kidney: Renal measurements: 11.2 x 5.3 x 5.9 cm = volume: 183 mL.  Diffuse increased renal parenchyma echogenicity. No hydronephrosis or shadowing stone. Trace perinephric fluid. Bladder: The urinary bladder is collapsed and not visualized. Other: There is diffuse increased liver echogenicity most commonly seen in the setting of fatty infiltration. Superimposed inflammation or fibrosis is not excluded. Clinical correlation is recommended. IMPRESSION: 1. Echogenic kidneys likely related to underlying medical renal disease. No hydronephrosis or shadowing stone. 2. Fatty liver. Electronically Signed   By: Elgie Collard M.D.   On: 07/29/2020 19:19   CT RENAL STONE STUDY  Result Date: 07/28/2020 CLINICAL DATA:  Left-sided flank, left lower quadrant pain and nausea 2 days, history of renal stones, dark urine EXAM: CT ABDOMEN AND PELVIS WITHOUT CONTRAST TECHNIQUE: Multidetector CT imaging of the abdomen and pelvis was performed following the standard protocol without IV contrast. COMPARISON:  02/15/2007 FINDINGS: Lower chest: No acute abnormality. Hepatobiliary: No solid liver abnormality is seen. Hepatic steatosis. No gallstones, gallbladder wall thickening, or biliary dilatation. Pancreas: Unremarkable. No pancreatic ductal dilatation or surrounding inflammatory changes. Spleen: Normal in size without significant abnormality. Adrenals/Urinary Tract: Adrenal glands are unremarkable. Multiple small bilateral renal calculi. No hydronephrosis. Bladder is unremarkable. Stomach/Bowel: Stomach is within normal limits. Appendix appears normal. There is some suggestion of colonic wall thickening, particularly the transverse colon (series 5, image 28). Vascular/Lymphatic: No significant vascular findings are present. No enlarged abdominal or pelvic lymph nodes. Reproductive: No mass or other significant abnormality. Other: No abdominal wall hernia or abnormality. Small volume free fluid in the low pelvis. Musculoskeletal: No acute or significant osseous findings. IMPRESSION: 1. There is  some suggestion of colonic wall thickening, particularly the transverse colon, suggestive of nonspecific infectious or inflammatory colitis. Correlate with referable signs and symptoms. 2. Small volume nonspecific free fluid in the low pelvis, likely reactive. 3. Multiple small bilateral renal calculi without evidence of ureteral calculus or hydronephrosis. 4. Hepatic steatosis. Electronically Signed   By: Lauralyn Primes M.D.   On: 07/28/2020 14:55   US LIVER DOPPLER  Result Date: 07/29/2020 CLINICAL DATA:  Acute liver failure and acetaminophen overdose. EXAM: DUPLEX ULTRASOUND OF LIVER TECHNIQUE: Color and duplex Doppler ultrasound was performed to evaluate the hepatic in-flow and out-flow vessels. COMPARISON:  None. FINDINGS: Portal Vein Velocities Main:  62 cm/sec Right:  20 cm/sec Left:  40 cm/sec Hepatic Vein Velocities Right:  56 cm/sec Middle:  60 cm/sec Left:  67 cm/sec Hepatic Artery Velocity:  132 cm/sec Splenic Vein Velocity:  65 cm/sec Varices: None visualized. Ascites: None visualized. Portal vein flow is towards the liver. No evidence of portal vein thrombus. Normal portal vein waveforms. Hepatic vein waveforms are normal. No evidence of hepatic veno-occlusive disease. The spleen is normal in size. Intrahepatic IVC is patent. IMPRESSION: Unremarkable hepatic duplex ultrasound. Electronically Signed   By: Irish Lack M.D.   On: 07/29/2020 13:39  US Abdomen Limited RUQ  Result Date: 07/28/2020 CLINICAL DATA:  Elevated liver function tests, abdominal pain EXAM: ULTRASOUND ABDOMEN LIMITED RIGHT UPPER QUADRANT COMPARISON:  None. FINDINGS: Gallbladder: The gallbladder is decompressed. No intraluminal stones or sludge is identified. The gallbladder wall, however, appears slightly thickened and edematous, particularly involving its hepatic segment. This is nonspecific, but can be seen in the setting of inflammatory conditions, such as cholangitis or hepatitis, or in the setting of  hypoproteinemia/anasarca, though this is considered less likely given the lack of associated ascites. Common bile duct: Diameter: 5 mm in proximal diameter. The distal duct is obscured by overlying bowel gas. Liver: No focal lesion identified. Within normal limits in parenchymal echogenicity. Portal vein is patent on color Doppler imaging with normal direction of blood flow towards the liver. Other: No ascites is identified IMPRESSION: Edematous change of the gallbladder wall. This can be seen in the setting of underlying inflammation, as can be seen with hepatitis or cholangitis. Correlation with laboratory examination may be helpful for further evaluation. Electronically Signed   By: Helyn Numbers MD   On: 07/28/2020 16:48      Active Problems:   Liver failure (HCC)   Tylenol overdose   AKI (acute kidney injury) (HCC)   Colitis   Elevated INR   ETOH abuse     LOS: 2 days   Willette Cluster ,NP 07/30/2020, 11:50 AM

## 2020-07-30 NOTE — Progress Notes (Signed)
[facility] Progress Note  Subjective: UOP 300 cc today so far, creat up to 6.5, BUN 30's.  Pt is alert w/ n/v, no SOB or cough.   Vitals:   07/30/20 0445 07/30/20 0500 07/30/20 0750 07/30/20 0800  BP:    (!) 147/97  Pulse:   88 90  Resp:   (!) 22 20  Temp:    98.4 F (36.9 C)  TempSrc:    Oral  SpO2:   93% 92%  Weight: 77.6 kg 77.6 kg    Height:        Exam: Gen alert, calm and appropriate JVP about 10 cm Chest maybe some soft rales at bases, o/w clear bilat RRR no MRG, sharp PMI Abd soft ntnd no mass or ascites +bs GU normal male w/ foley cath draining dark amber clear urine MS no joint effusions or deformity Ext diffuse 1-2+ edema or UE's and upper legs Neuro is alert, Ox 3 , nf, no asterixis    Home meds:  - tylenol prn     UA 8/9 / 21  - cloudy, amber, mod Hb, trace LE, > 300 prot, 0-5 rbc, 21-50 wbc, wbc clumps +., rare bact and +yeast   UNa 59  UCr 150    Na 138 K 6.2  CO2 18  BN 22  Cr 4.75  Ca 8.3  Alb 2.9  AST >10K, ALT 6756   Tbili 6.9   EGFR 14, tprot 5.7   Lact acid 2.7  tyloneol level = 14      Liver US - normal portal flow      EKG sinus tach, no ischemic changes normal QRS   I/O since admit 6.2 L in and 1.0 L out    UOP today 300 cc recorded (1st UOP)   Urine sediment 8/10 - small amt gran casts, 50-100 monomorphic rbc's / hpf, 3-5 wbc/ hpf   Assessment/ Plan: 1. Renal failure - presumably acute in setting of acute liver toxicity in pt w/ hx etoh abuse.  Pt is vol depleted, but no other known nephrotoxins (acei, nsaid, etc) involved.  Sediment showed small amt granular casts and monomorphic rbc's. Pigment nephropathy from bilirubin can occur but usually at higher levels. CPK is wnl. Creat up from 2.2 on admit to 6.51 today. No signs of sig uremia and no indication for RRT yet. Did start to make urine today. Pt aware of sig risk for requiring dialysis/ CRRT in next few days.  2. BP/ vol - BP's high , vol up now after saline loading, will  lower IVF to 50 cc/hr.  3. Acute liver failure - INR up, ^^AST/ ALT, GI following 4. Hyperkalemia - resolved, be sure to keep him on renal diet for now 5. ETOH abuse 6. Hx cocaine abuse        Rob Ellen Goris 07/30/2020, 9:51 AM   Recent Labs  Lab 07/28/20 1336 07/29/20 0658 07/29/20 1719 07/30/20 0154  K 4.1   < > 4.1 4.0  BUN 14   < > 29* 31*  CREATININE 2.26*   < > 5.84* 6.51*  CALCIUM 9.0   < > 7.9* 7.4*  PHOS  --   --   --  3.6  HGB 15.3  --   --  10.1*   < > = values in this interval not displayed.   Inpatient medications: . Chlorhexidine Gluconate Cloth  6 each Topical Daily  . Chlorhexidine Gluconate Cloth  6 each Topical Q0600  . mupirocin ointment  1 application Nasal BID  . pantoprazole  40 mg Oral Daily  . phytonadione  10 mg Subcutaneous Daily   . sodium chloride    . acetylcysteine 15 mg/kg/hr (07/30/20 0800)  . dextrose 5 % and 0.45% NaCl 150 mL/hr at 07/30/20 0800   sodium chloride, docusate sodium, HYDROmorphone (DILAUDID) injection, ondansetron (ZOFRAN) IV, polyethylene glycol

## 2020-07-30 NOTE — Progress Notes (Signed)
eLink Physician-Brief Progress Note Patient Name: Paul Reese DOB: 09-12-1981 MRN: [id number]   Date of Service  07/30/2020  HPI/Events of Note  Hiccups - QTc interval = 0.426 seconds.   eICU Interventions  Plan: 1. Reglan 10 mg IV X 1 now.      Intervention Category Major Interventions: Other:  [name] 07/30/2020, 7:45 PM

## 2020-07-30 NOTE — Progress Notes (Addendum)
Patient again having painful hiccups tonight. Called E-link for medication to help with them. New order for one time dose of 10 mg IV Reglan. Will continue to monitor.
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
## 2020-07-30 NOTE — TOC Initial Note (Signed)
Transition of Care [facility]) - Initial/Assessment Note    Patient Details  Name: Paul Reese MRN: [id number] Date of Birth: [birth year]  [facility]) CM/SW Contact:    Golda Acre, RN Phone Number: 07/30/2020, 7:58 AM  Clinical Narrative:                 Pt took too much tylenol for pain.  Iv acetylcysteine infusion,liver panel abnormal, Following for dc needs and progression.  Expected Discharge Plan: Home/Self Care Barriers to Discharge: Continued Medical Work up   Patient Goals and CMS Choice Patient states their goals for this hospitalization and ongoing recovery are:: to go home I did not mean to take all that I was just hurting. CMS Medicare.gov Compare Post Acute Care list provided to:: Patient    Expected Discharge Plan and Services Expected Discharge Plan: Home/Self Care   Discharge Planning Services: CM Consult   Living arrangements for the past 2 months: Single Family Home                                      Prior Living Arrangements/Services Living arrangements for the past 2 months: Single Family Home Lives with:: Self Patient language and need for interpreter reviewed:: Yes Do you feel safe going back to the place where you live?: Yes      Need for Family Participation in Patient Care: Yes (Comment) Care giver support system in place?: Yes (comment)   Criminal Activity/Legal Involvement Pertinent to Current Situation/Hospitalization: No - Comment as needed  Activities of Daily Living Home Assistive Devices/Equipment: None ADL Screening (condition at time of admission) Patient's cognitive ability adequate to safely complete daily activities?: Yes Is the patient deaf or have difficulty hearing?: No Does the patient have difficulty seeing, even when wearing glasses/contacts?: No Does the patient have difficulty concentrating, remembering, or making decisions?: No Patient able to express need for assistance with ADLs?:  Yes Does the patient have difficulty dressing or bathing?: No Independently performs ADLs?: Yes (appropriate for developmental age) Does the patient have difficulty walking or climbing stairs?: No Weakness of Legs: Both Weakness of Arms/Hands: None  Permission Sought/Granted                  Emotional Assessment Appearance:: Appears stated age Attitude/Demeanor/Rapport: Engaged Affect (typically observed): Calm Orientation: : Oriented to Place, Oriented to Self, Oriented to  Time, Oriented to Situation Alcohol / Substance Use: Illicit Drugs, Alcohol Use, Tobacco Use Psych Involvement: No (comment)  Admission diagnosis:  Colitis [K52.9] Liver failure (HCC) [K72.90] ETOH abuse [F10.10] Pain [R52] Elevated INR [R79.1] Elevated LFTs [R79.89] AKI (acute kidney injury) (HCC) [N17.9] Elevated lactic acid level [R79.89] Accidental acetaminophen overdose, initial encounter [T39.1X1A] Acute liver failure without hepatic coma [K72.00] Tylenol overdose [T39.1X1A] Patient Active Problem List   Diagnosis Date Noted  . Tylenol overdose 07/29/2020  . AKI (acute kidney injury) (HCC)   . Colitis   . Elevated INR   . ETOH abuse   . Liver failure (HCC) 07/28/2020   PCP:  Pennie Banter, MD Pharmacy:   [facility] [address], [place] - [address] [place] [address] [place] [address]  [place] [address] Phone: [phone] Fax: [phone]     Social Determinants of Health (SDOH) Interventions    Readmission Risk Interventions No flowsheet data found.

## 2020-07-31 ENCOUNTER — Inpatient Hospital Stay (HOSPITAL_COMMUNITY): Payer: Medicaid Other

## 2020-07-31 DIAGNOSIS — D689 Coagulation defect, unspecified: Secondary | ICD-10-CM

## 2020-07-31 LAB — COMPREHENSIVE METABOLIC PANEL
ALT: 2467 U/L — ABNORMAL HIGH (ref 0–44)
AST: 3381 U/L — ABNORMAL HIGH (ref 15–41)
Albumin: 2.3 g/dL — ABNORMAL LOW (ref 3.5–5.0)
Alkaline Phosphatase: 138 U/L — ABNORMAL HIGH (ref 38–126)
Anion gap: 19 — ABNORMAL HIGH (ref 5–15)
BUN: 42 mg/dL — ABNORMAL HIGH (ref 6–20)
CO2: 18 mmol/L — ABNORMAL LOW (ref 22–32)
Calcium: 8.2 mg/dL — ABNORMAL LOW (ref 8.9–10.3)
Chloride: 97 mmol/L — ABNORMAL LOW (ref 98–111)
Creatinine, Ser: 9.11 mg/dL — ABNORMAL HIGH (ref 0.61–1.24)
GFR calc Af Amer: 8 mL/min — ABNORMAL LOW (ref >60)
GFR calc non Af Amer: 6 mL/min — ABNORMAL LOW (ref >60)
Glucose, Bld: 92 mg/dL (ref 70–99)
Potassium: 4.4 mmol/L (ref 3.5–5.1)
Sodium: 134 mmol/L — ABNORMAL LOW (ref 135–145)
Total Bilirubin: 9.7 mg/dL — ABNORMAL HIGH (ref 0.3–1.2)
Total Protein: 4.4 g/dL — ABNORMAL LOW (ref 6.5–8.1)

## 2020-07-31 LAB — HSV(HERPES SIMPLEX VRS) I + II AB-IGM: HSVI/II Comb IgM: <0.91 Ratio (ref 0.00–0.90)

## 2020-07-31 LAB — CMV DNA, QUANTITATIVE, PCR
CMV DNA Quant: NEGATIVE IU/mL
Log10 CMV Qn DNA Pl: UNDETERMINED log10 IU/mL

## 2020-07-31 LAB — CBC
HCT: 30.2 % — ABNORMAL LOW (ref 39.0–52.0)
Hemoglobin: 10.5 g/dL — ABNORMAL LOW (ref 13.0–17.0)
MCH: 32.1 pg (ref 26.0–34.0)
MCHC: 34.8 g/dL (ref 30.0–36.0)
MCV: 92.4 fL (ref 80.0–100.0)
Platelets: 108 10*3/uL — ABNORMAL LOW (ref 150–400)
RBC: 3.27 MIL/uL — ABNORMAL LOW (ref 4.22–5.81)
RDW: 12.8 % (ref 11.5–15.5)
WBC: 5.5 10*3/uL (ref 4.0–10.5)
nRBC: 0 % (ref 0.0–0.2)

## 2020-07-31 LAB — RENAL FUNCTION PANEL
Albumin: 2.4 g/dL — ABNORMAL LOW (ref 3.5–5.0)
Anion gap: 14 (ref 5–15)
BUN: 45 mg/dL — ABNORMAL HIGH (ref 6–20)
CO2: 20 mmol/L — ABNORMAL LOW (ref 22–32)
Calcium: 7.9 mg/dL — ABNORMAL LOW (ref 8.9–10.3)
Chloride: 100 mmol/L (ref 98–111)
Creatinine, Ser: 8.57 mg/dL — ABNORMAL HIGH (ref 0.61–1.24)
GFR calc Af Amer: 8 mL/min — ABNORMAL LOW (ref >60)
GFR calc non Af Amer: 7 mL/min — ABNORMAL LOW (ref >60)
Glucose, Bld: 94 mg/dL (ref 70–99)
Phosphorus: 3.1 mg/dL (ref 2.5–4.6)
Potassium: 4.3 mmol/L (ref 3.5–5.1)
Sodium: 134 mmol/L — ABNORMAL LOW (ref 135–145)

## 2020-07-31 LAB — PROTIME-INR
INR: 1.5 — ABNORMAL HIGH (ref 0.8–1.2)
Prothrombin Time: 17.2 seconds — ABNORMAL HIGH (ref 11.4–15.2)

## 2020-07-31 LAB — AMMONIA: Ammonia: 69 umol/L — ABNORMAL HIGH (ref 9–35)

## 2020-07-31 MED ORDER — PRISMASOL BGK 4/2.5 32-4-2.5 MEQ/L REPLACEMENT SOLN
Status: DC
  Filled 2020-07-31 (×4): qty 5000

## 2020-07-31 MED ORDER — PRISMASOL BGK 4/2.5 32-4-2.5 MEQ/L IV SOLN
INTRAVENOUS | Status: DC
  Filled 2020-07-31 (×17): qty 5000

## 2020-07-31 MED ORDER — SODIUM CHLORIDE 0.9 % FOR CRRT: INTRAVENOUS_CENTRAL | Status: DC | PRN

## 2020-07-31 MED ORDER — BOOST / RESOURCE BREEZE PO LIQD CUSTOM
1.0000 | Freq: Two times a day (BID) | ORAL | Status: DC
  Administered 2020-07-31 – 2020-08-14 (×18): 1 via ORAL

## 2020-07-31 MED ORDER — HEPARIN SODIUM (PORCINE) 1000 UNIT/ML DIALYSIS
1000.0000 [IU] | INTRAMUSCULAR | Status: DC | PRN
  Administered 2020-08-02: 3000 [IU] via INTRAVENOUS_CENTRAL
  Administered 2020-08-03: 2400 [IU] via INTRAVENOUS_CENTRAL
  Filled 2020-07-31: qty 2
  Filled 2020-07-31 (×2): qty 6

## 2020-07-31 MED ORDER — PRISMASOL BGK 4/2.5 32-4-2.5 MEQ/L REPLACEMENT SOLN
Status: DC
  Filled 2020-07-31 (×2): qty 5000

## 2020-07-31 MED ORDER — LABETALOL HCL 5 MG/ML IV SOLN
10.0000 mg | INTRAVENOUS | Status: DC | PRN
  Administered 2020-07-31: 10 mg via INTRAVENOUS
  Administered 2020-08-01 – 2020-08-02 (×6): 20 mg via INTRAVENOUS
  Administered 2020-08-04: 10 mg via INTRAVENOUS
  Administered 2020-08-05 – 2020-08-07 (×3): 20 mg via INTRAVENOUS
  Filled 2020-07-31 (×11): qty 4

## 2020-07-31 MED ORDER — ALTEPLASE 2 MG IJ SOLR: 2.0000 mg | Freq: Once | INTRAMUSCULAR | Status: DC | PRN

## 2020-07-31 MED ORDER — LIP MEDEX EX OINT
TOPICAL_OINTMENT | CUTANEOUS | Status: DC | PRN
  Filled 2020-07-31: qty 7

## 2020-07-31 NOTE — Progress Notes (Signed)
While patient is in a deep sleep, his O2 sats started dropping to the high 80s on RA. When awakened and asked to take some deep breaths, his O2 comes up to 90 to 92% on RA. Notified E-link. New order for oxygen. Will continue to monitor.

## 2020-07-31 NOTE — Progress Notes (Signed)
[facility] Progress Note  Subjective: nauseous and hallucinating (seeing "ants on the wall") and somnolent, UOP better 475 but creat up to 9.11.   Vitals:   07/31/20 0520 07/31/20 0600 07/31/20 0700 07/31/20 0800  BP:  (!) 155/99 (!) 157/100 (!) 154/107  Pulse: 91 81 80 73  Resp: 13 19 19  (!) 21  Temp:    98 F (36.7 C)  TempSrc:    Oral  SpO2: 92% 96% 97% 97%  Weight:      Height:        Exam: Gen looks tired, sleepy, hiccups JVP about 10 cm Chest - clear bilat RRR no MRG, sharp PMI Abd soft ntnd no mass or ascites +bs GU normal male w/ foley cath draining dark amber clear urine MS no joint effusions or deformity Ext diffuse 1-2+ edema  Neuro is alert, Ox 3 , nf, no asterixis    Home meds:  - tylenol prn     UA 8/9 / 21  - cloudy, amber, mod Hb, trace LE, > 300 prot, 0-5 rbc, 21-50 wbc, wbc clumps +., rare bact and +yeast   UNa 59  UCr 150    Na 138 K 6.2  CO2 18  BN 22  Cr 4.75  Ca 8.3  Alb 2.9  AST >10K, ALT 6756   Tbili 6.9   EGFR 14, tprot 5.7   Lact acid 2.7  tyloneol level = 14      Liver US - normal portal flow      EKG sinus tach, no ischemic changes normal QRS   I/O since admit 6.2 L in and 1.0 L out    UOP today 300 cc recorded (1st UOP)   Urine sediment 8/10 - small amt gran casts, 50-100 monomorphic rbc's / hpf, 3-5 wbc/ hpf   Assessment/ Plan: 1. Renal failure - presumably acute in setting of acute liver toxicity in pt w/ hx etoh abuse.  Pt is vol depleted, but no other typical nephrotoxins (acei, nsaid, etc) involved.  Sediment showed small amt granular casts and monomorphic rbc's. Pigment nephropathy from bilirubin can occur but usually at higher levels. CPK is wnl. Suspect ATN, unclear cause. Creat up from 2.2 on admit 8/9 > 4.75 > 6.51 > and 9.11 today. UOP a bit better, however w/ new neuro symptoms will need RRT.  Start CRRT here and when stable and if still needing RRT, tx to [facility] for iHD. Have d/w primary team.  2. BP/ vol - BP's  high , vol up now, resp stable. DC IVF's.  3. Acute liver failure - INR up, ^^AST/ ALT, GI following 4. Hyperkalemia - resolved 5. ETOH abuse 6. Hx cocaine abuse        Rob Harmonie Verrastro 07/31/2020, 8:57 AM   Recent Labs  Lab 07/30/20 0154 07/30/20 0154 07/30/20 1700 07/31/20 0223  K 4.0   < > 4.0 4.4  BUN 31*   < > 37* 42*  CREATININE 6.51*   < > 7.81* 9.11*  CALCIUM 7.4*   < > 8.1* 8.2*  PHOS 3.6  --   --   --   HGB 10.1*  --   --  10.5*   < > = values in this interval not displayed.   Inpatient medications: . Chlorhexidine Gluconate Cloth  6 each Topical Q0600  . mupirocin ointment  1 application Nasal BID  . pantoprazole (PROTONIX) IV  40 mg Intravenous Q24H  . phytonadione  10 mg Subcutaneous Daily   .  sodium chloride    . dextrose 5 % and 0.45% NaCl 50 mL/hr at 07/31/20 0739   sodium chloride, calcium carbonate, docusate sodium, hydrocortisone, HYDROmorphone (DILAUDID) injection, labetalol, LORazepam, ondansetron (ZOFRAN) IV, polyethylene glycol

## 2020-07-31 NOTE — Procedures (Addendum)
Central Venous Catheter Insertion Procedure Note  SOPHEAP BOEHLE  [id number]  1981-07-26  Date:07/31/20  Time:10:55 AM   Provider Performing:Maddelynn Moosman   Procedure: Insertion of Non-tunneled Central Venous Catheter(36556)with US guidance (02637)    Indication(s) Hemodialysis  Consent Risks of the procedure as well as the alternatives and risks of each were explained to the patient and/or caregiver.  Consent for the procedure was obtained and is signed in the bedside chart  Anesthesia Topical only with 1% lidocaine   Timeout Verified patient identification, verified procedure, site/side was marked, verified correct patient position, special equipment/implants available, medications/allergies/relevant history reviewed, required imaging and test results available.  Sterile Technique Maximal sterile technique including full sterile barrier drape, hand hygiene, sterile gown, sterile gloves, mask, hair covering, sterile ultrasound probe cover (if used).  Procedure Description Area of catheter insertion was cleaned with chlorhexidine and draped in sterile fashion.   With real-time ultrasound realtime guidance to visualize needle entry a HD catheter was placed into the right internal jugular vein.  Nonpulsatile blood flow and easy flushing noted in all ports.  The catheter was sutured in place and sterile dressing applied.  Complications/Tolerance None; patient tolerated the procedure well. Chest X-ray is ordered.  EBL Minimal  Specimen(s) None      Dyesha Henault MD [facility] Please see [email or web address] for pager details.  07/31/2020, 10:56 AM

## 2020-07-31 NOTE — Progress Notes (Signed)
eLink Physician-Brief Progress Note Patient Name: RILEN SHUKLA DOB: 12-06-1981 MRN: [id number]   Date of Service  07/31/2020  HPI/Events of Note  Elevated blood pressure.  eICU Interventions  PRN Labetalol order changed to 10-20 mg iv Q 2 hours PRN.        Thomasene Lot Lua Feng 07/31/2020, 8:04 PM
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
## 2020-07-31 NOTE — Progress Notes (Signed)
CRRT started at 1222. Pt. Remains oriented but has times of confusion after waking up. Pt. Has had two boosts and chicken noodle soup without feeling nauseas or throwing up.

## 2020-07-31 NOTE — Progress Notes (Signed)
eLink Physician-Brief Progress Note Patient Name: Paul Reese DOB: September 24, 1981 MRN: [id number]   Date of Service  07/31/2020  HPI/Events of Note  Patient dropping O2 sat to 89-90% while asleep.   eICU Interventions  Plan: 1. O2 therapy: 2 L/min [place]. Titrate to keep sats >= 92%     Intervention Category Major Interventions: Hypoxemia - evaluation and management  [name] 07/31/2020, 1:29 AM

## 2020-08-01 ENCOUNTER — Encounter (HOSPITAL_COMMUNITY): Payer: Self-pay

## 2020-08-01 DIAGNOSIS — T391X1D Poisoning by 4-Aminophenol derivatives, accidental (unintentional), subsequent encounter: Secondary | ICD-10-CM

## 2020-08-01 DIAGNOSIS — S36119D Unspecified injury of liver, subsequent encounter: Secondary | ICD-10-CM

## 2020-08-01 LAB — RENAL FUNCTION PANEL
Albumin: 2.5 g/dL — ABNORMAL LOW (ref 3.5–5.0)
Albumin: 2.5 g/dL — ABNORMAL LOW (ref 3.5–5.0)
Anion gap: 12 (ref 5–15)
Anion gap: 8 (ref 5–15)
BUN: 29 mg/dL — ABNORMAL HIGH (ref 6–20)
BUN: 24 mg/dL — ABNORMAL HIGH (ref 6–20)
CO2: 23 mmol/L (ref 22–32)
CO2: 26 mmol/L (ref 22–32)
Calcium: 7.9 mg/dL — ABNORMAL LOW (ref 8.9–10.3)
Calcium: 7.8 mg/dL — ABNORMAL LOW (ref 8.9–10.3)
Chloride: 99 mmol/L (ref 98–111)
Chloride: 98 mmol/L (ref 98–111)
Creatinine, Ser: 6.2 mg/dL — ABNORMAL HIGH (ref 0.61–1.24)
Creatinine, Ser: 5.57 mg/dL — ABNORMAL HIGH (ref 0.61–1.24)
GFR calc Af Amer: 12 mL/min — ABNORMAL LOW (ref >60)
GFR calc Af Amer: 14 mL/min — ABNORMAL LOW (ref >60)
GFR calc non Af Amer: 10 mL/min — ABNORMAL LOW (ref >60)
GFR calc non Af Amer: 12 mL/min — ABNORMAL LOW (ref >60)
Glucose, Bld: 104 mg/dL — ABNORMAL HIGH (ref 70–99)
Glucose, Bld: 109 mg/dL — ABNORMAL HIGH (ref 70–99)
Phosphorus: 2.8 mg/dL (ref 2.5–4.6)
Phosphorus: 3.3 mg/dL (ref 2.5–4.6)
Potassium: 4 mmol/L (ref 3.5–5.1)
Potassium: 4.2 mmol/L (ref 3.5–5.1)
Sodium: 134 mmol/L — ABNORMAL LOW (ref 135–145)
Sodium: 132 mmol/L — ABNORMAL LOW (ref 135–145)

## 2020-08-01 LAB — AMMONIA: Ammonia: 36 umol/L — ABNORMAL HIGH (ref 9–35)

## 2020-08-01 LAB — APTT: aPTT: 31 seconds (ref 24–36)

## 2020-08-01 LAB — PROTIME-INR
INR: 1.2 (ref 0.8–1.2)
Prothrombin Time: 14.7 seconds (ref 11.4–15.2)

## 2020-08-01 LAB — HEPATIC FUNCTION PANEL
ALT: 1754 U/L — ABNORMAL HIGH (ref 0–44)
AST: 840 U/L — ABNORMAL HIGH (ref 15–41)
Albumin: 2.5 g/dL — ABNORMAL LOW (ref 3.5–5.0)
Alkaline Phosphatase: 160 U/L — ABNORMAL HIGH (ref 38–126)
Bilirubin, Direct: 8.9 mg/dL — ABNORMAL HIGH (ref 0.0–0.2)
Indirect Bilirubin: 4.3 mg/dL — ABNORMAL HIGH (ref 0.3–0.9)
Total Bilirubin: 13.2 mg/dL — ABNORMAL HIGH (ref 0.3–1.2)
Total Protein: 4.9 g/dL — ABNORMAL LOW (ref 6.5–8.1)

## 2020-08-01 LAB — MAGNESIUM: Magnesium: 2 mg/dL (ref 1.7–2.4)

## 2020-08-01 MED ORDER — PANTOPRAZOLE SODIUM 40 MG PO TBEC
40.0000 mg | DELAYED_RELEASE_TABLET | Freq: Two times a day (BID) | ORAL | Status: DC
  Administered 2020-08-01 – 2020-08-14 (×24): 40 mg via ORAL
  Filled 2020-08-01 (×25): qty 1

## 2020-08-01 NOTE — Progress Notes (Signed)
eLink Physician-Brief Progress Note Patient Name: Paul Reese DOB: Jun 30, 1981 MRN: [id number]   Date of Service  08/01/2020  HPI/Events of Note  Bedside RN requesting Benadryl for itching.  eICU Interventions  Will not order Benadryl due to likelihood it will exacerbate his confusion.        [name] 08/01/2020, 9:27 PM

## 2020-08-01 NOTE — Progress Notes (Signed)
eLink Physician-Brief Progress Note Patient Name: Paul Reese DOB: Oct 04, 1981 MRN: [id number]   Date of Service  08/01/2020  HPI/Events of Note  Patient had an 8 beat run of unsustained wide complex tachycardia, electrolytes are intact.  eICU Interventions  No intervention at this time, will continue to monitor patient.        [name] 08/01/2020, 12:44 AM

## 2020-08-01 NOTE — Progress Notes (Signed)
RN gave pt. 0.5mg  Ativan based off a CIWA of 8 and some symptoms of withdrawal including HTN, tremors, and itching. PT. Soon after pt. started hallucinating severely, talking in his sleep, and become agitated. RN called family and let them speak with pt. Charge RN will alert AC that pt. Mom can come see him to help him relax until the affects of the ativan subside. RN has given lobetalol a few times today to help blood pressure it works slightly but not well. With liver and kidney issues there aren't many other bp med options. RN would recommend not to give pt. Ativan again.
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
## 2020-08-01 NOTE — Progress Notes (Signed)
[name] Progress Note  Subjective: nausea is much better, hallucinations are better but not completely gone. I/O yest +440, UOP yest 200 cc. Taking clear liquids well. Cr down 9 > 6 today on CRRT.   Vitals:   08/01/20 0400 08/01/20 0500 08/01/20 0606 08/01/20 0700  BP: (!) 166/116 (!) 157/107 (!) 150/103 (!) 150/112  Pulse: 75 76 78 74  Resp: 20  18 20   Temp: 98.8 F (37.1 C)     TempSrc: Oral     SpO2: 94% 93% 92% 94%  Weight:  78.9 kg    Height:        Exam: Gen more alert and normal appearing JVP about 10 cm Chest - clear bilat RRR no MRG, sharp PMI Abd soft ntnd no mass or ascites +bs GU normal male w/ foley cath draining dark amber clear urine MS no joint effusions or deformity Ext diffuse 1+ edema  Neuro is alert, Ox 3 , nf, no asterixis    Home meds:  - tylenol prn     UA 8/9 / 21  - cloudy, amber, mod Hb, trace LE, > 300 prot, 0-5 rbc, 21-50 wbc, wbc clumps +., rare bact and +yeast   UNa 59  UCr 150    Na 138 K 6.2  CO2 18  BN 22  Cr 4.75  Ca 8.3  Alb 2.9  AST >10K, ALT 6756   Tbili 6.9   EGFR 14, tprot 5.7   Lact acid 2.7  tyloneol level = 14      Liver US - normal portal flow      EKG sinus tach, no ischemic changes normal QRS   I/O since admit 6.2 L in and 1.0 L out    UOP today 300 cc recorded (1st UOP)   Urine sediment 8/10 - small amt gran casts, 50-100 monomorphic rbc's / hpf, 3-5 wbc/ hpf   Assessment/ Plan: 1. Renal failure - presumably acute in setting of acute liver toxicity in pt w/ hx etoh abuse.  Pt was vol depleted, but no other typical nephrotoxins (acei, nsaid, etc) involved.  Sediment showed small amt granular casts and monomorphic rbc's. Pigment nephropathy from bilirubin can cause ATN. Suspect ATN, unclear cause. Creat up from 2.2 on admit 8/9, CRRT started w/ creat 9.1 and uremic symptoms on 8/12. Doing well today, symptoms resolving. Cont CRRT. ^UF 100/ hr. Min UOP still.  2. BP/ vol - BP's high , vol up now, resp  stable. DC'd IVF's.  3. Acute liver failure - INR and AST/ ALT down, tbili up 13, per GI 4. Hyperkalemia - resolved 5. ETOH abuse 6. Hx cocaine abuse        Paul Reese 08/01/2020, 7:47 AM   Recent Labs  Lab 07/30/20 0154 07/30/20 1700 07/31/20 0223 07/31/20 0223 07/31/20 1528 08/01/20 0413  K 4.0   < > 4.4   < > 4.3 4.0  BUN 31*   < > 42*   < > 45* 29*  CREATININE 6.51*   < > 9.11*   < > 8.57* 6.20*  CALCIUM 7.4*   < > 8.2*   < > 7.9* 7.9*  PHOS 3.6  --   --   --  3.1 2.8  HGB 10.1*  --  10.5*  --   --   --    < > = values in this interval not displayed.   Inpatient medications: . Chlorhexidine Gluconate Cloth  6 each Topical Q0600  . feeding supplement  1 Container Oral BID BM  . mupirocin ointment  1 application Nasal BID  . pantoprazole (PROTONIX) IV  40 mg Intravenous Q24H   .  prismasol BGK 4/2.5 400 mL/hr at 07/31/20 1228  .  prismasol BGK 4/2.5 200 mL/hr at 07/31/20 1227  . sodium chloride    . prismasol BGK 4/2.5 2,000 mL/hr at 07/31/20 1745   sodium chloride, alteplase, calcium carbonate, docusate sodium, heparin, hydrocortisone, HYDROmorphone (DILAUDID) injection, labetalol, lip balm, LORazepam, ondansetron (ZOFRAN) IV, polyethylene glycol, sodium chloride
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
## 2020-08-01 NOTE — TOC Progression Note (Signed)
Transition of Care [facility]) - Progression Note    Patient Details  Name: Paul Reese MRN: [id number] Date of Birth: 05/24/1981  Transition of Care [facility]) CM/SW Contact  Golda Acre, RN Phone Number: 08/01/2020, 8:25 AM  Clinical Narrative:    Pt continues on CRRT for bun of 29 and creat-6.20, hx of tylenol overdose, hepatic functions are all abnormal.  Still having some hallucinations but fewer. Following for progress and toc needs. May need inpatient psych due to hx and overdose.   Expected Discharge Plan: Home/Self Care Barriers to Discharge: Continued Medical Work up  Expected Discharge Plan and Services Expected Discharge Plan: Home/Self Care   Discharge Planning Services: CM Consult   Living arrangements for the past 2 months: Single Family Home                                       Social Determinants of Health (SDOH) Interventions    Readmission Risk Interventions No flowsheet data found.

## 2020-08-02 ENCOUNTER — Inpatient Hospital Stay (HOSPITAL_COMMUNITY): Payer: Medicaid Other

## 2020-08-02 LAB — RENAL FUNCTION PANEL
Albumin: 2.3 g/dL — ABNORMAL LOW (ref 3.5–5.0)
Albumin: 2.4 g/dL — ABNORMAL LOW (ref 3.5–5.0)
Anion gap: 8 (ref 5–15)
Anion gap: 6 (ref 5–15)
BUN: 20 mg/dL (ref 6–20)
BUN: 17 mg/dL (ref 6–20)
CO2: 23 mmol/L (ref 22–32)
CO2: 26 mmol/L (ref 22–32)
Calcium: 7.5 mg/dL — ABNORMAL LOW (ref 8.9–10.3)
Calcium: 7.8 mg/dL — ABNORMAL LOW (ref 8.9–10.3)
Chloride: 96 mmol/L — ABNORMAL LOW (ref 98–111)
Chloride: 100 mmol/L (ref 98–111)
Creatinine, Ser: 4.71 mg/dL — ABNORMAL HIGH (ref 0.61–1.24)
Creatinine, Ser: 4.09 mg/dL — ABNORMAL HIGH (ref 0.61–1.24)
GFR calc Af Amer: 17 mL/min — ABNORMAL LOW (ref >60)
GFR calc Af Amer: 20 mL/min — ABNORMAL LOW (ref >60)
GFR calc non Af Amer: 14 mL/min — ABNORMAL LOW (ref >60)
GFR calc non Af Amer: 17 mL/min — ABNORMAL LOW (ref >60)
Glucose, Bld: 125 mg/dL — ABNORMAL HIGH (ref 70–99)
Glucose, Bld: 118 mg/dL — ABNORMAL HIGH (ref 70–99)
Phosphorus: 3.3 mg/dL (ref 2.5–4.6)
Phosphorus: 2.6 mg/dL (ref 2.5–4.6)
Potassium: 4 mmol/L (ref 3.5–5.1)
Potassium: 4.5 mmol/L (ref 3.5–5.1)
Sodium: 127 mmol/L — ABNORMAL LOW (ref 135–145)
Sodium: 132 mmol/L — ABNORMAL LOW (ref 135–145)

## 2020-08-02 LAB — COMPREHENSIVE METABOLIC PANEL
ALT: 1183 U/L — ABNORMAL HIGH (ref 0–44)
AST: 320 U/L — ABNORMAL HIGH (ref 15–41)
Albumin: 2.4 g/dL — ABNORMAL LOW (ref 3.5–5.0)
Alkaline Phosphatase: 180 U/L — ABNORMAL HIGH (ref 38–126)
Anion gap: 9 (ref 5–15)
BUN: 20 mg/dL (ref 6–20)
CO2: 25 mmol/L (ref 22–32)
Calcium: 7.7 mg/dL — ABNORMAL LOW (ref 8.9–10.3)
Chloride: 99 mmol/L (ref 98–111)
Creatinine, Ser: 4.73 mg/dL — ABNORMAL HIGH (ref 0.61–1.24)
GFR calc Af Amer: 17 mL/min — ABNORMAL LOW (ref >60)
GFR calc non Af Amer: 14 mL/min — ABNORMAL LOW (ref >60)
Glucose, Bld: 128 mg/dL — ABNORMAL HIGH (ref 70–99)
Potassium: 4.3 mmol/L (ref 3.5–5.1)
Sodium: 133 mmol/L — ABNORMAL LOW (ref 135–145)
Total Bilirubin: 12.1 mg/dL — ABNORMAL HIGH (ref 0.3–1.2)
Total Protein: 5 g/dL — ABNORMAL LOW (ref 6.5–8.1)

## 2020-08-02 LAB — PROTIME-INR
INR: 1.1 (ref 0.8–1.2)
Prothrombin Time: 14 seconds (ref 11.4–15.2)

## 2020-08-02 LAB — CBC
HCT: 32.3 % — ABNORMAL LOW (ref 39.0–52.0)
Hemoglobin: 11.3 g/dL — ABNORMAL LOW (ref 13.0–17.0)
MCH: 32.2 pg (ref 26.0–34.0)
MCHC: 35 g/dL (ref 30.0–36.0)
MCV: 92 fL (ref 80.0–100.0)
Platelets: 103 10*3/uL — ABNORMAL LOW (ref 150–400)
RBC: 3.51 MIL/uL — ABNORMAL LOW (ref 4.22–5.81)
RDW: 13.4 % (ref 11.5–15.5)
WBC: 6.1 10*3/uL (ref 4.0–10.5)
nRBC: 0 % (ref 0.0–0.2)

## 2020-08-02 LAB — PHOSPHORUS: Phosphorus: 3.5 mg/dL (ref 2.5–4.6)

## 2020-08-02 LAB — HSV DNA BY PCR (REFERENCE LAB)
HSV 1 DNA: NEGATIVE
HSV 2 DNA: NEGATIVE

## 2020-08-02 LAB — EPSTEIN BARR VRS(EBV DNA BY PCR)
EBV DNA QN by PCR: NEGATIVE copies/mL
log10 EBV DNA Qn PCR: UNDETERMINED log10 copy/mL

## 2020-08-02 LAB — APTT: aPTT: 33 seconds (ref 24–36)

## 2020-08-02 LAB — MAGNESIUM: Magnesium: 2.3 mg/dL (ref 1.7–2.4)

## 2020-08-02 LAB — AMMONIA: Ammonia: 34 umol/L (ref 9–35)

## 2020-08-02 NOTE — Progress Notes (Addendum)
[facility] Progress Note  Subjective: pt feeling good today, BUN 20/ creat 4.3.  Tbili down a bit today. - 2L net yet, -1.2 L today.   Vitals:   08/02/20 0800 08/02/20 0829 08/02/20 0900 08/02/20 1000  BP: (!) 135/103  (!) 126/99 (!) 171/116  Pulse:      Resp: _0 Temp:  98 F (36.7 C)    TempSrc:  Oral    SpO2:      Weight:      Height:        Exam: Gen alert, no c/o's today JVP about 10 cm Chest - clear bilat RRR no MRG, sharp PMI Abd soft ntnd no mass or ascites +bs GU normal male w/ foley cath draining dark amber clear urine MS no joint effusions or deformity Ext no sig edema Neuro is alert, Ox 3 , nf, no asterixis    Home meds:  - tylenol prn     UA 8/9 / 21  - cloudy, amber, mod Hb, trace LE, > 300 prot, 0-5 rbc, 21-50 wbc, wbc clumps +., rare bact and +yeast   UNa 59  UCr 150    Na 138 K 6.2  CO2 18  BN 22  Cr 4.75  Ca 8.3  Alb 2.9  AST >10K, ALT 6756   Tbili 6.9   EGFR 14, tprot 5.7   Lact acid 2.7  tyloneol level = 14      Liver US - normal portal flow      EKG sinus tach, no ischemic changes normal QRS   I/O since admit 6.2 L in and 1.0 L out    UOP today 300 cc recorded (1st UOP)   Urine sediment 8/10 - small amt gran casts, 50-100 monomorphic rbc's / hpf, 3-5 wbc/ hpf    I/O net since admit = 11L in and 7L out = +4L    Wt's admit 71kg, peak 80kg, today 78.9kg   Assessment/ Plan: 1. Renal failure - presumably acute in setting of acute liver toxicity in pt w/ hx etoh abuse.  Pt was vol depleted, but no other typical nephrotoxins (acei, nsaid, etc) involved.  Sediment showed small amt granular casts and monomorphic rbc's. Pigment nephropathy from bilirubin can cause ATN. Suspect ATN, unclear cause. Making some urine 100- 300 cc/d.  CRRT started for creat 9.1 and uremic symptoms on 8/12.  Pt could be treated from here w/ intermittent HD, however not sure there are beds at [facility].  Will change to CRRT 12 hr/ day for now starting today on  the dayshift. Would recommend he be put on list for tx to [facility] when a bed is available (?SDU or tele bed since is relatively stable).  Will d/w CCM.  2. BP/ vol - Vol stable, wt's down a bit, BP's good. Last 2 CXR"s suggestive of early CHF, will cont UF 100- 150 cc/hr w/ CRRT.  3. Acute liver failure - INR and AST/ ALT down, tbili down today 1st time. Per GI 4. Hyperkalemia - resolved 5. ETOH abuse 6. Hx cocaine abuse        Rob Syair Fricker 08/02/2020, 11:54 AM   Recent Labs  Lab 07/31/20 0223 07/31/20 1528 08/01/20 1600 08/02/20 0423  K 4.4   < > 4.2 4.3  4.0  BUN 42*   < > 24* 20  20  CREATININE 9.11*   < > 5.57* 4.73*  4.71*  CALCIUM 8.2*   < > 7.8* 7.7*  7.5*  PHOS  --    < > 3.3 3.5  3.3  HGB 10.5*  --   --  11.3*   < > = values in this interval not displayed.   Inpatient medications: . Chlorhexidine Gluconate Cloth  6 each Topical Q0600  . feeding supplement  1 Container Oral BID BM  . mupirocin ointment  1 application Nasal BID  . pantoprazole  40 mg Oral BID   .  prismasol BGK 4/2.5 400 mL/hr at 08/02/20 0420  .  prismasol BGK 4/2.5 200 mL/hr at 08/01/20 1421  . sodium chloride    . prismasol BGK 4/2.5 2,000 mL/hr at 08/02/20 1107   sodium chloride, alteplase, calcium carbonate, docusate sodium, heparin, hydrocortisone, HYDROmorphone (DILAUDID) injection, labetalol, lip balm, ondansetron (ZOFRAN) IV, polyethylene glycol, sodium chloride
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
## 2020-08-03 ENCOUNTER — Inpatient Hospital Stay (HOSPITAL_COMMUNITY): Payer: Medicaid Other

## 2020-08-03 DIAGNOSIS — R933 Abnormal findings on diagnostic imaging of other parts of digestive tract: Secondary | ICD-10-CM

## 2020-08-03 DIAGNOSIS — R945 Abnormal results of liver function studies: Secondary | ICD-10-CM

## 2020-08-03 DIAGNOSIS — K920 Hematemesis: Secondary | ICD-10-CM

## 2020-08-03 DIAGNOSIS — D649 Anemia, unspecified: Secondary | ICD-10-CM

## 2020-08-03 DIAGNOSIS — R1030 Lower abdominal pain, unspecified: Secondary | ICD-10-CM

## 2020-08-03 LAB — RENAL FUNCTION PANEL
Albumin: 2.4 g/dL — ABNORMAL LOW (ref 3.5–5.0)
Anion gap: 8 (ref 5–15)
BUN: 22 mg/dL — ABNORMAL HIGH (ref 6–20)
CO2: 23 mmol/L (ref 22–32)
Calcium: 7.9 mg/dL — ABNORMAL LOW (ref 8.9–10.3)
Chloride: 97 mmol/L — ABNORMAL LOW (ref 98–111)
Creatinine, Ser: 5.69 mg/dL — ABNORMAL HIGH (ref 0.61–1.24)
GFR calc Af Amer: 13 mL/min — ABNORMAL LOW (ref >60)
GFR calc non Af Amer: 11 mL/min — ABNORMAL LOW (ref >60)
Glucose, Bld: 93 mg/dL (ref 70–99)
Phosphorus: 4.8 mg/dL — ABNORMAL HIGH (ref 2.5–4.6)
Potassium: 4.8 mmol/L (ref 3.5–5.1)
Sodium: 128 mmol/L — ABNORMAL LOW (ref 135–145)

## 2020-08-03 LAB — HEPATIC FUNCTION PANEL
ALT: 798 U/L — ABNORMAL HIGH (ref 0–44)
AST: 157 U/L — ABNORMAL HIGH (ref 15–41)
Albumin: 2.3 g/dL — ABNORMAL LOW (ref 3.5–5.0)
Alkaline Phosphatase: 184 U/L — ABNORMAL HIGH (ref 38–126)
Bilirubin, Direct: 6.1 mg/dL — ABNORMAL HIGH (ref 0.0–0.2)
Indirect Bilirubin: 3.2 mg/dL — ABNORMAL HIGH (ref 0.3–0.9)
Total Bilirubin: 9.3 mg/dL — ABNORMAL HIGH (ref 0.3–1.2)
Total Protein: 5 g/dL — ABNORMAL LOW (ref 6.5–8.1)

## 2020-08-03 LAB — MAGNESIUM: Magnesium: 2.4 mg/dL (ref 1.7–2.4)

## 2020-08-03 LAB — APTT: aPTT: 34 seconds (ref 24–36)

## 2020-08-03 LAB — PROTIME-INR
INR: 1.1 (ref 0.8–1.2)
Prothrombin Time: 14 seconds (ref 11.4–15.2)

## 2020-08-03 LAB — AMMONIA: Ammonia: 40 umol/L — ABNORMAL HIGH (ref 9–35)

## 2020-08-03 NOTE — Plan of Care (Signed)
  Problem: Education: Goal: Knowledge of General Education information will improve Description: Including pain rating scale, medication(s)/side effects and non-pharmacologic comfort measures Outcome: Progressing   Problem: Health Behavior/Discharge Planning: Goal: Ability to manage health-related needs will improve Outcome: Progressing   Problem: Clinical Measurements: Goal: Ability to maintain clinical measurements within normal limits will improve Outcome: Progressing Goal: Will remain free from infection Outcome: Progressing Goal: Diagnostic test results will improve Outcome: Progressing Goal: Respiratory complications will improve Outcome: Progressing Goal: Cardiovascular complication will be avoided Outcome: Progressing   Problem: Activity: Goal: Risk for activity intolerance will decrease Outcome: Progressing   Problem: Nutrition: Goal: Adequate nutrition will be maintained Outcome: Progressing   Problem: Coping: Goal: Level of anxiety will decrease Outcome: Progressing   Problem: Elimination: Goal: Will not experience complications related to bowel motility Outcome: Progressing Goal: Will not experience complications related to urinary retention Outcome: Progressing   Problem: Pain Managment: Goal: General experience of comfort will improve Outcome: Progressing   Problem: Safety: Goal: Ability to remain free from injury will improve Outcome: Progressing   Problem: Skin Integrity: Goal: Risk for impaired skin integrity will decrease Outcome: Progressing   Problem: Education: Goal: Knowledge of disease or condition will improve Outcome: Progressing Goal: Understanding of discharge needs will improve Outcome: Progressing   Problem: Health Behavior/Discharge Planning: Goal: Ability to identify changes in lifestyle to reduce recurrence of condition will improve Outcome: Progressing Goal: Identification of resources available to assist in meeting health  care needs will improve Outcome: Progressing   Problem: Physical Regulation: Goal: Complications related to the disease process, condition or treatment will be avoided or minimized Outcome: Progressing   Problem: Safety: Goal: Ability to remain free from injury will improve Outcome: Progressing   Problem: Education: Goal: Knowledge of disease and its progression will improve Outcome: Progressing   Problem: Health Behavior/Discharge Planning: Goal: Ability to manage health-related needs will improve Outcome: Progressing   Problem: Clinical Measurements: Goal: Complications related to the disease process or treatment will be avoided or minimized Outcome: Progressing Goal: Dialysis access will remain free of complications Outcome: Progressing   Problem: Activity: Goal: Activity intolerance will improve Outcome: Progressing   Problem: Fluid Volume: Goal: Fluid volume balance will be maintained or improved Outcome: Progressing   Problem: Nutritional: Goal: Ability to make appropriate dietary choices will improve Outcome: Progressing   Problem: Respiratory: Goal: Respiratory symptoms related to disease process will be avoided Outcome: Progressing   Problem: Self-Concept: Goal: Body image disturbance will be avoided or minimized Outcome: Progressing   Problem: Urinary Elimination: Goal: Progression of disease will be identified and treated Outcome: Progressing  - Due for CRRT tomorrow at 0630, CN Janelle informed that this RN is not trained to do the procedure, as per CN dayshift will resume the CRRT in the morning, will continue to monitor

## 2020-08-03 NOTE — Progress Notes (Addendum)
[facility] Progress Note  Subjective: 225 cc UOP yest, total net I/O yest was -1.3 L. Wt's down slightly to 78kg.   Vitals:   08/03/20 0700 08/03/20 0800 08/03/20 0910 08/03/20 1000  BP: (!) 146/103 (!) 152/122 (!) 142/92 140/90  Pulse: 79 78 82 71  Resp: 19 18 (!) 25 (!) 21  Temp:      TempSrc:      SpO2: 100% 99% 100% 100%  Weight:      Height:        Exam: Gen alert, no c/o's today JVP about 10 cm Chest - crackles mild L base, R clear RRR no MRG, sharp PMI Abd soft ntnd no mass or ascites +bs GU normal male w/ foley cath draining dark amber clear urine MS no joint effusions or deformity Ext mild edema of ext Neuro is alert, Ox 3 , nf, no asterixis    Home meds:  - tylenol prn     UA 8/9 / 21  - cloudy, amber, mod Hb, trace LE, > 300 prot, 0-5 rbc, 21-50 wbc, wbc clumps +., rare bact and +yeast   UNa 59  UCr 150    Na 138 K 6.2  CO2 18  BN 22  Cr 4.75  Ca 8.3  Alb 2.9  AST >10K, ALT 6756   Tbili 6.9   EGFR 14, tprot 5.7   Lact acid 2.7  tyloneol level = 14      Liver US - normal portal flow      EKG sinus tach, no ischemic changes normal QRS   I/O since admit 6.2 L in and 1.0 L out    UOP today 300 cc recorded (1st UOP)   Urine sediment 8/10 - small amt gran casts, 50-100 monomorphic rbc's / hpf, 3-5 wbc/ hpf    I/O net since admit = 2.9 L as of 8/15      Assessment/ Plan: 1. Renal failure - presumably acute in setting of acute liver toxicity in pt w/ hx etoh abuse.  Pt was vol depleted, but no other typical nephrotoxins (acei, nsaid, etc) involved.  Sediment showed small amt granular casts and monomorphic rbc's. Pigment nephropathy from bilirubin can cause ATN. Suspect ATN. Still is not recovering, making small amts of dark urine. CRRT started for creat 9.1 and uremic symptoms on 8/12.  Pt could be treated from here w/ intermittent HD, d/w CCM , will put in for transfer to floor/ tele bed and also for transfer to [facility] today.  In meantime until he  gets a bed, will keep on CRRT 12 hrs/ day.  2. BP/ vol - Vol stable, wt's down a bit, BP's good. Last 2 CXR"s suggestive of early CHF, will ^UF to 200 cc/hr x 12 hrs.  3. Acute liver failure/ tylenol toxicity - sp IV NAC treatment. INR and AST/ ALT down, tbili down trending now too. Per GI 4. Hyperkalemia - resolved 5. ETOH abuse 6. Hx cocaine abuse        Rob Pratyush Ammon 08/03/2020, 11:12 AM   Recent Labs  Lab 07/31/20 0223 07/31/20 1528 08/02/20 0423 08/02/20 0423 08/02/20 1600 08/03/20 0429  K 4.4   < > 4.3   4.0   < > 4.5 4.8  BUN 42*   < > 20   20   < > 17 22*  CREATININE 9.11*   < > 4.73*   4.71*   < > 4.09* 5.69*  CALCIUM 8.2*   < > 7.7*  7.5*   < > 7.8* 7.9*  PHOS  --    < > 3.5   3.3   < > 2.6 4.8*  HGB 10.5*  --  11.3*  --   --   --    < > = values in this interval not displayed.   Inpatient medications:  Chlorhexidine Gluconate Cloth  6 each Topical Q0600   feeding supplement  1 Container Oral BID BM   pantoprazole  40 mg Oral BID     prismasol BGK 4/2.5 400 mL/hr at 08/03/20 0902    prismasol BGK 4/2.5 200 mL/hr at 08/03/20 0902   sodium chloride     prismasol BGK 4/2.5 2,000 mL/hr at 08/03/20 0902   sodium chloride, alteplase, calcium carbonate, docusate sodium, heparin, hydrocortisone, HYDROmorphone (DILAUDID) injection, labetalol, lip balm, ondansetron (ZOFRAN) IV, polyethylene glycol, sodium chloride
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
## 2020-08-03 NOTE — Progress Notes (Signed)
Transfer Note:   Traveling Method: Hydrologist Unit: [employer] ICU/Stepdown Mental Orientation: A&O X4 Telemetry: 14M13 Assessment: See Flowsheets Skin: See Flowsheets IV: WDL Pain: 8/10 Safety Measures: Safety Fall Prevention Plan has been given, discussed and signed Admission: Completed 14M Orientation: Patient has been orientated to the room, unit and staff.   Orders have been reviewed and implemented. Will continue to monitor the patient. Call light has been placed within reach and bed alarm has been activated.   Selina Cooley BSN, RN3
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
## 2020-08-04 DIAGNOSIS — Z8719 Personal history of other diseases of the digestive system: Secondary | ICD-10-CM

## 2020-08-04 LAB — PROTIME-INR
INR: 1 (ref 0.8–1.2)
Prothrombin Time: 13 seconds (ref 11.4–15.2)

## 2020-08-04 LAB — BASIC METABOLIC PANEL
Anion gap: 9 (ref 5–15)
BUN: 26 mg/dL — ABNORMAL HIGH (ref 6–20)
CO2: 21 mmol/L — ABNORMAL LOW (ref 22–32)
Calcium: 7.9 mg/dL — ABNORMAL LOW (ref 8.9–10.3)
Chloride: 97 mmol/L — ABNORMAL LOW (ref 98–111)
Creatinine, Ser: 7.79 mg/dL — ABNORMAL HIGH (ref 0.61–1.24)
GFR calc Af Amer: 9 mL/min — ABNORMAL LOW (ref >60)
GFR calc non Af Amer: 8 mL/min — ABNORMAL LOW (ref >60)
Glucose, Bld: 90 mg/dL (ref 70–99)
Potassium: 4.9 mmol/L (ref 3.5–5.1)
Sodium: 127 mmol/L — ABNORMAL LOW (ref 135–145)

## 2020-08-04 LAB — APTT: aPTT: 34 seconds (ref 24–36)

## 2020-08-04 LAB — HEPATIC FUNCTION PANEL
ALT: 579 U/L — ABNORMAL HIGH (ref 0–44)
AST: 99 U/L — ABNORMAL HIGH (ref 15–41)
Albumin: 2.1 g/dL — ABNORMAL LOW (ref 3.5–5.0)
Alkaline Phosphatase: 187 U/L — ABNORMAL HIGH (ref 38–126)
Bilirubin, Direct: 4.9 mg/dL — ABNORMAL HIGH (ref 0.0–0.2)
Indirect Bilirubin: 3 mg/dL — ABNORMAL HIGH (ref 0.3–0.9)
Total Bilirubin: 7.9 mg/dL — ABNORMAL HIGH (ref 0.3–1.2)
Total Protein: 5.1 g/dL — ABNORMAL LOW (ref 6.5–8.1)

## 2020-08-04 LAB — AMMONIA: Ammonia: 46 umol/L — ABNORMAL HIGH (ref 9–35)

## 2020-08-04 LAB — MAGNESIUM: Magnesium: 2.4 mg/dL (ref 1.7–2.4)

## 2020-08-04 MED ORDER — SODIUM CHLORIDE 0.9% FLUSH
10.0000 mL | Freq: Two times a day (BID) | INTRAVENOUS | Status: DC
  Administered 2020-08-04 – 2020-08-11 (×4): 10 mL

## 2020-08-04 MED ORDER — SODIUM CHLORIDE 0.9% FLUSH
10.0000 mL | INTRAVENOUS | Status: DC | PRN
  Administered 2020-08-06: 10 mL

## 2020-08-04 MED ORDER — CHLORHEXIDINE GLUCONATE CLOTH 2 % EX PADS
6.0000 | MEDICATED_PAD | Freq: Every day | CUTANEOUS | Status: DC
  Administered 2020-08-04: 6 via TOPICAL
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
## 2020-08-04 NOTE — H&P (View-Only) (Signed)
Daily Rounding Note  08/04/2020, 8:45 AM  LOS: 7 days   SUBJECTIVE:   Chief complaint: acute liver injury.  R abd pain.  Black emesis    N/V resolved, eating solids w good appetite.  R abd pain, mid to lower, persists.  Used 2.5 mg Dilaudid yesterday, 68m so far today.   Urine out put improved and color getting less dark.    OBJECTIVE:         Vital signs in last 24 hours:    Temp:  [98 F (36.7 C)-99.3 F (37.4 C)] 98.9 F (37.2 C) (08/16 0456) Pulse Rate:  [65-82] 79 (08/16 0456) Resp:  [18-25] 18 (08/16 0456) BP: (133-154)/(90-98) 139/92 (08/16 0456) SpO2:  [94 %-100 %] 95 % (08/16 0456) Weight:  [78 kg] 78 kg (08/16 0418) Last BM Date: 08/03/20 Filed Weights   08/01/20 0500 08/03/20 0500 08/04/20 0418  Weight: 78.9 kg 78 kg 78 kg   General: somewhat ill but not toxic.  Alert.  Slightly uncomfortable   Heart: RRR Chest: clear bil Neck: HD catheter on R neck.   Abdomen: soft, ND.  Active BS.  Tenderness in R abdomen w/o G/R.  Extremities: no CCE GU: urine is medium to deep yellow Neuro/Psych:  Pleasant, cooperative, fluid speech.  No tremors or weakness.    Intake/Output from previous day: 08/15 0701 - 08/16 0700 In: 817 [P.O.:817] Out: 1461 [Urine:76]  Intake/Output this shift: No intake/output data recorded.  Lab Results: Recent Labs    08/02/20 0423  WBC 6.1  HGB 11.3*  HCT 32.3*  PLT 103*   BMET Recent Labs    08/02/20 0423 08/02/20 1600 08/03/20 0429  NA 133*  127* 132* 128*  K 4.3  4.0 4.5 4.8  CL 99  96* 100 97*  CO2 25  23 26 23   GLUCOSE 128*  125* 118* 93  BUN 20  20 17  22*  CREATININE 4.73*  4.71* 4.09* 5.69*  CALCIUM 7.7*  7.5* 7.8* 7.9*   LFT Recent Labs    08/02/20 0423 08/02/20 0423 08/02/20 1600 08/03/20 0429 08/04/20 0303  PROT 5.0*  --   --  5.0* 5.1*  ALBUMIN 2.4*  2.3*   < > 2.4* 2.4*  2.3* 2.1*  AST 320*  --   --  157* 99*  ALT 1,183*  --   --   798* 579*  ALKPHOS 180*  --   --  184* 187*  BILITOT 12.1*  --   --  9.3* 7.9*  BILIDIR  --   --   --  6.1* 4.9*  IBILI  --   --   --  3.2* 3.0*   < > = values in this interval not displayed.   PT/INR Recent Labs    08/03/20 0429 08/04/20 0303  LABPROT 14.0 13.0  INR 1.1 1.0   Hepatitis Panel No results for input(s): HEPBSAG, HCVAB, HEPAIGM, HEPBIGM in the last 72 hours.  Studies/Results: DG CHEST PORT 1 VIEW  Result Date: 08/02/2020 CLINICAL DATA:  Follow-up of a bilateral pulmonary infiltrates. EXAM: PORTABLE CHEST 1 VIEW COMPARISON:  Chest radiograph dated 07/31/2020. FINDINGS: The heart size and mediastinal contours are within normal limits. Bilateral mid and lower lung interstitial and airspace opacities appear unchanged. A small left pleural effusion is unchanged. There is no right pleural effusion. There is no pneumothorax. The visualized skeletal structures are unremarkable. A right internal jugular vascular sheath tip overlies the superior vena cava. IMPRESSION:  Unchanged bilateral mid and lower lung interstitial and airspace opacities. Unchanged left pleural effusion. Electronically Signed   By: Zerita Boers M.D.   On: 08/02/2020 11:42   [place] ASCITES (ABDOMEN LIMITED)  Result Date: 08/03/2020 CLINICAL DATA:  Evaluate for ascites. EXAM: LIMITED ABDOMEN ULTRASOUND FOR ASCITES TECHNIQUE: Limited ultrasound survey for ascites was performed in all four abdominal quadrants. COMPARISON:  Right upper quadrant ultrasound 07/28/2020 FINDINGS: Minimal ascites in the right lower quadrant. Stable changes of small amount of pericholecystic fluid versus edema of the gallbladder wall as the wall in fluid together measure 1 cm unchanged. No evidence of cholelithiasis. Right pleural effusion. IMPRESSION: 1.  Minimal ascites right lower quadrant. 2. Stable nonspecific edematous gallbladder wall versus pericholecystic fluid. Electronically Signed   By: Marin Olp M.D.   On: 08/03/2020 17:13     ASSESMENT:   *   Acute liver injury. ETOH abuse/APAP, ? Shock liver.  Fatty liver per CT.   Completed NAC LFTs steadily improving though Alk phos lagging, not surprising in setting of renal failure.    Lower abd pain: minor RLQ ascites (likely too small to tap, low suspicion for SBP), stable GB edema vs pericholecystic fluid on ultrasound. Low suspicion for cholecystitis.  *   N/V, black emesis. CT w ? Transverse colitis   Lower abd pain: minor RLQ ascites, stable GB edema vs pericholecystic fluid on ultrasound.      *   [place] anemia.  Thrombocytopenia, improved.    *   AKI, renal failure.  CRRT for 12 hours daily per yest Nephro note.  Renal following, no plans for HD  *   Hyponatremia.     PLAN   *  EGD tmrw at [place].    *   Supportive care.       Paul Reese  08/04/2020, 8:45 AM Phone [phone]

## 2020-08-04 NOTE — Progress Notes (Signed)
[facility] Progress Note  Admit: 07/28/2020 LOS: 7  Subjective:   Patient states that he is starting to feel better. He admits to abdominal pain, particularly on the RLQ. His appetite is improving and denies nausea or vomiting. He states that he is urinating more frequently and had a BM overnight that was normal color and consistency. He denies chest pain, SHOB, lower extremity swelling or overt symptoms of uremia.   08/15 0701 - 08/16 0700 In: 817 [P.O.:817] Out: 1461 [Urine:76]  Filed Weights   08/01/20 0500 08/03/20 0500 08/04/20 0418  Weight: 78.9 kg 78 kg 78 kg    Scheduled Meds: . Chlorhexidine Gluconate Cloth  6 each Topical Daily  . feeding supplement  1 Container Oral BID BM  . pantoprazole  40 mg Oral BID  . sodium chloride flush  10-40 mL Intracatheter Q12H   Continuous Infusions: .  prismasol BGK 4/2.5 400 mL/hr at 08/03/20 0902  .  prismasol BGK 4/2.5 200 mL/hr at 08/03/20 0902  . sodium chloride    . prismasol BGK 4/2.5 2,000 mL/hr at 08/03/20 1150   PRN Meds:.sodium chloride, alteplase, calcium carbonate, docusate sodium, heparin, hydrocortisone, HYDROmorphone (DILAUDID) injection, labetalol, lip balm, ondansetron (ZOFRAN) IV, polyethylene glycol, sodium chloride, sodium chloride flush    Physical Exam:  Blood pressure (!) 139/92, pulse 79, temperature 98.9 F (37.2 C), temperature source Oral, resp. rate 18, height 6\' 1"  (1.854 m), weight 78 kg, SpO2 95 %. Physical Exam Constitutional:      General: He is not in acute distress.    Appearance: He is not diaphoretic.  HENT:     Head: Normocephalic and atraumatic.     Mouth/Throat:     Mouth: Mucous membranes are moist.     Pharynx: Oropharynx is clear.  Eyes:     General: Scleral icterus present.     Extraocular Movements: Extraocular movements intact.  Cardiovascular:     Rate and Rhythm: Normal rate and regular rhythm.     Heart sounds: Normal heart sounds. No murmur heard.  No friction  rub.  Pulmonary:     Effort: Pulmonary effort is normal. No respiratory distress.     Breath sounds: Rales (bilateral bases (R>L)) present.  Chest:     Chest wall: No tenderness.  Abdominal:     General: Bowel sounds are normal. There is distension.     Tenderness: There is no guarding or rebound.     Comments: Mild tenderness to palpation of RLQ and LLQ  Skin:    General: Skin is warm and dry.     Findings: No rash.  Neurological:     General: No focal deficit present.     Mental Status: He is alert.  Psychiatric:        Mood and Affect: Mood normal.    Assessment/Plan:  1. AKI: Presented with a creatinine of 2.26 consistent with an AKI. Patient developed worsening Cr as high as 9.11 (GFR 6) with a FeNa of 1.7% consistent with intrinsic renal injury. UA showed proteinuria and hemoglobinuria. Urine sediment showed small granular cast and monomorphic RBCs, concerning for ATN. Patient has not taken any nephrotoxic medication. Patient started on CRRT with improvement of Cr to 5.69 (GFR 11). Transferred to [facility] for HD. 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 His weight is down 2 kg since admission, but does have bibasilar crackles on exam. Otherwise, no signs of hypervolemia and net negative 600 cc  since admission with moderate amount of dark yellow urine in foley bag.  - Transition to HD from CRRT  - Daily weights, strict ins and outs, fluid restricted diet  2. Acute Liver Failure - Initially presented with a mixed hepatocellular and cholestasis pattern of liver injury with evidence of coagulopathy (INR 2.8), thrombocytopenia (85), hyperbilirubinemia (6.5) AGMA ( GAP 15, LA of 2.9) secondary to toxic acetaminophen ingestion. He is now s/p NAC and vitamin K. Minimal ascites was noted on the abdominal US with pericholecystic fluid w/o stones. Hepatitis labs were negative for viral etiologies including HCV/HBC, EBV HSV, or  CMV. Had an elevated ETOH and APAP, but negative UDS and ethylene glocol. Hepatic function continues to improve. Gi anticipate EGD to evaluate for signs of upper GI bleed.  3. Normocytic anemia: Likely secondary to Kidney Failure  4. Substance use disorder: history of ETOH and cocaine use. Presented with elevated ETOH but negative UDS.   Paul Reese, D.O. [facility] Internal Medicine, PGY-2 Pager: [phone], Phone: [phone] Date 08/04/2020 Time 8:10 AM   Recent Labs  Lab 08/02/20 0423 08/02/20 1600 08/03/20 0429  NA 133*  127* 132* 128*  K 4.3  4.0 4.5 4.8  CL 99  96* 100 97*  CO2 25  23 26 23   GLUCOSE 128*  125* 118* 93  BUN 20  20 17  22*  CREATININE 4.73*  4.71* 4.09* 5.69*  CALCIUM 7.7*  7.5* 7.8* 7.9*  PHOS 3.5  3.3 2.6 4.8*   Recent Labs  Lab 07/30/20 0154 07/31/20 0223 08/02/20 0423  WBC 5.5 5.5 6.1  HGB 10.1* 10.5* 11.3*  HCT 29.9* 30.2* 32.3*  MCV 94.6 92.4 92.0  PLT 85* 108* 103*

## 2020-08-04 NOTE — Progress Notes (Signed)
Daily Rounding Note  08/04/2020, 8:45 AM  LOS: 7 days   SUBJECTIVE:   Chief complaint: acute liver injury.  R abd pain.  Black emesis    N/V resolved, eating solids w good appetite.  R abd pain, mid to lower, persists.  Used 2.5 mg Dilaudid yesterday, 24m so far today.   Urine out put improved and color getting less dark.    OBJECTIVE:         Vital signs in last 24 hours:    Temp:  [98 F (36.7 C)-99.3 F (37.4 C)] 98.9 F (37.2 C) (08/16 0456) Pulse Rate:  [65-82] 79 (08/16 0456) Resp:  [18-25] 18 (08/16 0456) BP: (133-154)/(90-98) 139/92 (08/16 0456) SpO2:  [94 %-100 %] 95 % (08/16 0456) Weight:  [78 kg] 78 kg (08/16 0418) Last BM Date: 08/03/20 Filed Weights   08/01/20 0500 08/03/20 0500 08/04/20 0418  Weight: 78.9 kg 78 kg 78 kg   General: somewhat ill but not toxic.  Alert.  Slightly uncomfortable   Heart: RRR Chest: clear bil Neck: HD catheter on R neck.   Abdomen: soft, ND.  Active BS.  Tenderness in R abdomen w/o G/R.  Extremities: no CCE GU: urine is medium to deep yellow Neuro/Psych:  Pleasant, cooperative, fluid speech.  No tremors or weakness.    Intake/Output from previous day: 08/15 0701 - 08/16 0700 In: 817 [P.O.:817] Out: 1461 [Urine:76]  Intake/Output this shift: No intake/output data recorded.  Lab Results: Recent Labs    08/02/20 0423  WBC 6.1  HGB 11.3*  HCT 32.3*  PLT 103*   BMET Recent Labs    08/02/20 0423 08/02/20 1600 08/03/20 0429  NA 133*  127* 132* 128*  K 4.3  4.0 4.5 4.8  CL 99  96* 100 97*  CO2 25  23 26 23   GLUCOSE 128*  125* 118* 93  BUN 20  20 17  22*  CREATININE 4.73*  4.71* 4.09* 5.69*  CALCIUM 7.7*  7.5* 7.8* 7.9*   LFT Recent Labs    08/02/20 0423 08/02/20 0423 08/02/20 1600 08/03/20 0429 08/04/20 0303  PROT 5.0*  --   --  5.0* 5.1*  ALBUMIN 2.4*  2.3*   < > 2.4* 2.4*  2.3* 2.1*  AST 320*  --   --  157* 99*  ALT 1,183*  --   --   798* 579*  ALKPHOS 180*  --   --  184* 187*  BILITOT 12.1*  --   --  9.3* 7.9*  BILIDIR  --   --   --  6.1* 4.9*  IBILI  --   --   --  3.2* 3.0*   < > = values in this interval not displayed.   PT/INR Recent Labs    08/03/20 0429 08/04/20 0303  LABPROT 14.0 13.0  INR 1.1 1.0   Hepatitis Panel No results for input(s): HEPBSAG, HCVAB, HEPAIGM, HEPBIGM in the last 72 hours.  Studies/Results: DG CHEST PORT 1 VIEW  Result Date: 08/02/2020 CLINICAL DATA:  Follow-up of a bilateral pulmonary infiltrates. EXAM: PORTABLE CHEST 1 VIEW COMPARISON:  Chest radiograph dated 07/31/2020. FINDINGS: The heart size and mediastinal contours are within normal limits. Bilateral mid and lower lung interstitial and airspace opacities appear unchanged. A small left pleural effusion is unchanged. There is no right pleural effusion. There is no pneumothorax. The visualized skeletal structures are unremarkable. A right internal jugular vascular sheath tip overlies the superior vena cava. IMPRESSION:  Unchanged bilateral mid and lower lung interstitial and airspace opacities. Unchanged left pleural effusion. Electronically Signed   By: Zerita Boers M.D.   On: 08/02/2020 11:42   [place] ASCITES (ABDOMEN LIMITED)  Result Date: 08/03/2020 CLINICAL DATA:  Evaluate for ascites. EXAM: LIMITED ABDOMEN ULTRASOUND FOR ASCITES TECHNIQUE: Limited ultrasound survey for ascites was performed in all four abdominal quadrants. COMPARISON:  Right upper quadrant ultrasound 07/28/2020 FINDINGS: Minimal ascites in the right lower quadrant. Stable changes of small amount of pericholecystic fluid versus edema of the gallbladder wall as the wall in fluid together measure 1 cm unchanged. No evidence of cholelithiasis. Right pleural effusion. IMPRESSION: 1.  Minimal ascites right lower quadrant. 2. Stable nonspecific edematous gallbladder wall versus pericholecystic fluid. Electronically Signed   By: Marin Olp M.D.   On: 08/03/2020 17:13     ASSESMENT:   *   Acute liver injury. ETOH abuse/APAP, ? Shock liver.  Fatty liver per CT.   Completed NAC LFTs steadily improving though Alk phos lagging, not surprising in setting of renal failure.    Lower abd pain: minor RLQ ascites (likely too small to tap, low suspicion for SBP), stable GB edema vs pericholecystic fluid on ultrasound. Low suspicion for cholecystitis.  *   N/V, black emesis. CT w ? Transverse colitis   Lower abd pain: minor RLQ ascites, stable GB edema vs pericholecystic fluid on ultrasound.      *   [place] anemia.  Thrombocytopenia, improved.    *   AKI, renal failure.  CRRT for 12 hours daily per yest Nephro note.  Renal following, no plans for HD  *   Hyponatremia.     PLAN   *  EGD tmrw at [place].    *   Supportive care.       Paul Reese  08/04/2020, 8:45 AM Phone [phone]

## 2020-08-05 ENCOUNTER — Inpatient Hospital Stay (HOSPITAL_COMMUNITY): Payer: Medicaid Other | Admitting: Certified Registered"

## 2020-08-05 ENCOUNTER — Encounter (HOSPITAL_COMMUNITY)
Admission: EM | Disposition: A | Discharge status: Home / Self Care | Payer: Self-pay | Source: Home / Self Care | Attending: Internal Medicine

## 2020-08-05 ENCOUNTER — Encounter (HOSPITAL_COMMUNITY): Payer: Self-pay | Admitting: Pulmonary Disease

## 2020-08-05 ENCOUNTER — Other Ambulatory Visit: Payer: Self-pay | Admitting: Physician Assistant

## 2020-08-05 DIAGNOSIS — K2970 Gastritis, unspecified, without bleeding: Secondary | ICD-10-CM

## 2020-08-05 DIAGNOSIS — K226 Gastro-esophageal laceration-hemorrhage syndrome: Secondary | ICD-10-CM

## 2020-08-05 DIAGNOSIS — K2211 Ulcer of esophagus with bleeding: Secondary | ICD-10-CM

## 2020-08-05 DIAGNOSIS — K922 Gastrointestinal hemorrhage, unspecified: Secondary | ICD-10-CM

## 2020-08-05 HISTORY — PX: ESOPHAGOGASTRODUODENOSCOPY: SHX5428

## 2020-08-05 HISTORY — PX: BIOPSY: SHX5522

## 2020-08-05 LAB — PROTIME-INR
INR: 1 (ref 0.8–1.2)
Prothrombin Time: 13.1 seconds (ref 11.4–15.2)

## 2020-08-05 LAB — IRON AND TIBC
Iron: 67 ug/dL (ref 45–182)
Saturation Ratios: 23 % (ref 17.9–39.5)
TIBC: 291 ug/dL (ref 250–450)
UIBC: 224 ug/dL

## 2020-08-05 LAB — CBC
HCT: 27.3 % — ABNORMAL LOW (ref 39.0–52.0)
Hemoglobin: 9.5 g/dL — ABNORMAL LOW (ref 13.0–17.0)
MCH: 32 pg (ref 26.0–34.0)
MCHC: 34.8 g/dL (ref 30.0–36.0)
MCV: 91.9 fL (ref 80.0–100.0)
Platelets: 132 10*3/uL — ABNORMAL LOW (ref 150–400)
RBC: 2.97 MIL/uL — ABNORMAL LOW (ref 4.22–5.81)
RDW: 14.1 % (ref 11.5–15.5)
WBC: 8 10*3/uL (ref 4.0–10.5)
nRBC: 0 % (ref 0.0–0.2)

## 2020-08-05 LAB — COMPREHENSIVE METABOLIC PANEL
ALT: 438 U/L — ABNORMAL HIGH (ref 0–44)
AST: 61 U/L — ABNORMAL HIGH (ref 15–41)
Albumin: 2.3 g/dL — ABNORMAL LOW (ref 3.5–5.0)
Alkaline Phosphatase: 177 U/L — ABNORMAL HIGH (ref 38–126)
Anion gap: 11 (ref 5–15)
BUN: 41 mg/dL — ABNORMAL HIGH (ref 6–20)
CO2: 21 mmol/L — ABNORMAL LOW (ref 22–32)
Calcium: 8 mg/dL — ABNORMAL LOW (ref 8.9–10.3)
Chloride: 92 mmol/L — ABNORMAL LOW (ref 98–111)
Creatinine, Ser: 10.52 mg/dL — ABNORMAL HIGH (ref 0.61–1.24)
GFR calc Af Amer: 6 mL/min — ABNORMAL LOW (ref >60)
GFR calc non Af Amer: 5 mL/min — ABNORMAL LOW (ref >60)
Glucose, Bld: 91 mg/dL (ref 70–99)
Potassium: 4.7 mmol/L (ref 3.5–5.1)
Sodium: 124 mmol/L — ABNORMAL LOW (ref 135–145)
Total Bilirubin: 7 mg/dL — ABNORMAL HIGH (ref 0.3–1.2)
Total Protein: 5.3 g/dL — ABNORMAL LOW (ref 6.5–8.1)

## 2020-08-05 LAB — VITAMIN B12: Vitamin B-12: 1454 pg/mL — ABNORMAL HIGH (ref 180–914)

## 2020-08-05 LAB — RETICULOCYTES
Immature Retic Fract: 9.1 % (ref 2.3–15.9)
RBC.: 3.07 MIL/uL — ABNORMAL LOW (ref 4.22–5.81)
Retic Count, Absolute: 118.8 10*3/uL (ref 19.0–186.0)
Retic Ct Pct: 3.9 % — ABNORMAL HIGH (ref 0.4–3.1)

## 2020-08-05 LAB — FOLATE: Folate: 17.5 ng/mL

## 2020-08-05 LAB — PHOSPHORUS: Phosphorus: 8 mg/dL — ABNORMAL HIGH (ref 2.5–4.6)

## 2020-08-05 LAB — HEPATITIS B SURFACE ANTIGEN: Hepatitis B Surface Ag: NONREACTIVE

## 2020-08-05 LAB — HEPATITIS B CORE ANTIBODY, TOTAL: Hep B Core Total Ab: NONREACTIVE

## 2020-08-05 LAB — MAGNESIUM: Magnesium: 2.6 mg/dL — ABNORMAL HIGH (ref 1.7–2.4)

## 2020-08-05 LAB — AMMONIA: Ammonia: 33 umol/L (ref 9–35)

## 2020-08-05 LAB — APTT: aPTT: 35 seconds (ref 24–36)

## 2020-08-05 LAB — FERRITIN: Ferritin: 195 ng/mL (ref 24–336)

## 2020-08-05 SURGERY — EGD (ESOPHAGOGASTRODUODENOSCOPY)
Anesthesia: Monitor Anesthesia Care

## 2020-08-05 MED ORDER — GLYCOPYRROLATE 0.2 MG/ML IJ SOLN
INTRAMUSCULAR | Status: DC | PRN
Start: 2020-08-05 — End: 2020-08-05
  Administered 2020-08-05: .1 mg via INTRAVENOUS

## 2020-08-05 MED ORDER — CHLORHEXIDINE GLUCONATE CLOTH 2 % EX PADS
6.0000 | MEDICATED_PAD | Freq: Every day | CUTANEOUS | Status: DC
  Administered 2020-08-06 – 2020-08-14 (×9): 6 via TOPICAL

## 2020-08-05 MED ORDER — DEXMEDETOMIDINE (PRECEDEX) IN NS 20 MCG/5ML (4 MCG/ML) IV SYRINGE
PREFILLED_SYRINGE | INTRAVENOUS | Status: DC | PRN
  Administered 2020-08-05 (×5): 4 ug via INTRAVENOUS

## 2020-08-05 MED ORDER — FENTANYL CITRATE (PF) 100 MCG/2ML IJ SOLN
INTRAMUSCULAR | Status: DC | PRN
  Administered 2020-08-05: 50 ug via INTRAVENOUS

## 2020-08-05 MED ORDER — HEPARIN SODIUM (PORCINE) 1000 UNIT/ML IJ SOLN
INTRAMUSCULAR | Status: AC
  Administered 2020-08-05: 1000 [IU] via INTRAVENOUS_CENTRAL
  Filled 2020-08-05: qty 3

## 2020-08-05 MED ORDER — PROPOFOL 10 MG/ML IV BOLUS
INTRAVENOUS | Status: DC | PRN
  Administered 2020-08-05: 50 mg via INTRAVENOUS

## 2020-08-05 MED ORDER — PROPOFOL 500 MG/50ML IV EMUL
INTRAVENOUS | Status: DC | PRN
  Administered 2020-08-05: 150 ug/kg/min via INTRAVENOUS

## 2020-08-05 MED ORDER — SODIUM CHLORIDE 0.9 % IV SOLN: INTRAVENOUS | Status: DC

## 2020-08-05 NOTE — Anesthesia Preprocedure Evaluation (Addendum)
Anesthesia Evaluation  Patient identified by MRN, date of birth, ID band  Reviewed: Allergy & Precautions, NPO status , Patient's Chart, lab work & pertinent test results  Airway Mallampati: I  TM Distance: >3 FB Neck ROM: Full    Dental  (+) Partial Lower, Poor Dentition, Partial Upper   Pulmonary neg pulmonary ROS, Current Smoker,    Pulmonary exam normal breath sounds clear to auscultation       Cardiovascular Exercise Tolerance: Good negative cardio ROS Normal cardiovascular exam Rhythm:Regular Rate:Normal     Neuro/Psych PSYCHIATRIC DISORDERS negative neurological ROS     GI/Hepatic negative GI ROS, Liver failure (acute)   Endo/Other  hyponatremia  Renal/GU ARFRenal disease (starting dialysis today)  negative genitourinary   Musculoskeletal negative musculoskeletal ROS (+)   Abdominal Normal abdominal exam  (+)   Peds  Hematology  (+) Blood dyscrasia (thrombocytopenia), anemia ,   Anesthesia Other Findings   Reproductive/Obstetrics negative OB ROS                            Anesthesia Physical Anesthesia Plan  ASA: IV  Anesthesia Plan: MAC   Post-op Pain Management:    Induction: Intravenous  PONV Risk Score and Plan: 0  Airway Management Planned: Natural Airway and Simple Face Mask  Additional Equipment: None  Intra-op Plan:   Post-operative Plan:   Informed Consent: I have reviewed the patients History and Physical, chart, labs and discussed the procedure including the risks, benefits and alternatives for the proposed anesthesia with the patient or authorized representative who has indicated his/her understanding and acceptance.     Dental advisory given  Plan Discussed with: CRNA and Anesthesiologist  Anesthesia Plan Comments:        Anesthesia Quick Evaluation

## 2020-08-05 NOTE — Plan of Care (Signed)
  Problem: Education: Goal: Knowledge of General Education information will improve Description: Including pain rating scale, medication(s)/side effects and non-pharmacologic comfort measures Outcome: Progressing   Problem: Pain Managment: Goal: General experience of comfort will improve Outcome: Progressing   

## 2020-08-05 NOTE — Transfer of Care (Signed)
Immediate Anesthesia Transfer of Care Note  Patient: Paul Reese  Procedure(s) Performed: ESOPHAGOGASTRODUODENOSCOPY (EGD) (N/A ) BIOPSY  Patient Location: PACU  Anesthesia Type:MAC  Level of Consciousness: drowsy and patient cooperative  Airway & Oxygen Therapy: Patient Spontanous Breathing and Patient connected to nasal cannula oxygen  Post-op Assessment: Report given to RN and Post -op Vital signs reviewed and stable  Post vital signs: Reviewed and stable  Last Vitals:  Vitals Value Taken Time  BP 116/74 08/05/20 0952  Temp    Pulse 76 08/05/20 0953  Resp 16 08/05/20 0953  SpO2 92 % 08/05/20 0953  Vitals shown include unvalidated device data.  Last Pain:  Vitals:   08/05/20 0826  TempSrc: Oral  PainSc: 8       Patients Stated Pain Goal: 0 (03/88/82 8003)  Complications: No complications documented.
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
## 2020-08-05 NOTE — Interval H&P Note (Signed)
History and Physical Interval Note:  08/05/2020 9:03 AM  Paul Reese  has presented today for surgery, with the diagnosis of Variceal Screening.  The various methods of treatment have been discussed with the patient and family. After consideration of risks, benefits and other options for treatment, the patient has consented to  Procedure(s): ESOPHAGOGASTRODUODENOSCOPY (EGD) (N/A) as a surgical intervention.  The patient's history has been reviewed, patient examined, no change in status, stable for surgery.  I have reviewed the patient's chart and labs.  Questions were answered to the patient's satisfaction.     [employer]

## 2020-08-05 NOTE — Anesthesia Postprocedure Evaluation (Signed)
Anesthesia Post Note  Patient: Paul Reese  Procedure(s) Performed: ESOPHAGOGASTRODUODENOSCOPY (EGD) (N/A ) BIOPSY     Patient location during evaluation: PACU Anesthesia Type: MAC Level of consciousness: awake and alert Pain management: pain level controlled Vital Signs Assessment: post-procedure vital signs reviewed and stable Respiratory status: spontaneous breathing, nonlabored ventilation, respiratory function stable and patient connected to nasal cannula oxygen Cardiovascular status: stable and blood pressure returned to baseline Postop Assessment: no apparent nausea or vomiting Anesthetic complications: no   No complications documented.  Last Vitals:  Vitals:   08/05/20 1000 08/05/20 1010  BP: (!) 145/89 (!) 164/113  Pulse: 70 73  Resp: 14 16  Temp:    SpO2: 92% 96%    Last Pain:  Vitals:   08/05/20 1010  TempSrc:   PainSc: 0-No pain                 Merlinda Frederick

## 2020-08-05 NOTE — Progress Notes (Signed)
[facility] Progress Note  Admit: 07/28/2020 LOS: 8  Subjective:  Patient continues to have abdominal pain, particularly in the RLQ. He states that he is experiencing some generalized fatigue, which he believes is due to not eating overnight. He continues to have good urine output, but admits to recurrence of blood in his urine. Otherwise, he states that he is doing well. He denies any chest pain, SHOB, nausea, vomiting, pruritis, change in taste, dizziness, or LE swelling.   08/16 0701 - 08/17 0700 In: 680 [P.O.:680] Out: 750 [Urine:750]  Filed Weights   08/03/20 0500 08/04/20 0418 08/04/20 2100  Weight: 78 kg 78 kg 81.4 kg    Scheduled Meds: . Chlorhexidine Gluconate Cloth  6 each Topical Daily  . feeding supplement  1 Container Oral BID BM  . pantoprazole  40 mg Oral BID  . sodium chloride flush  10-40 mL Intracatheter Q12H   Continuous Infusions: . sodium chloride     PRN Meds:.sodium chloride, alteplase, calcium carbonate, docusate sodium, heparin, hydrocortisone, HYDROmorphone (DILAUDID) injection, labetalol, lip balm, ondansetron (ZOFRAN) IV, polyethylene glycol, sodium chloride, sodium chloride flush   Physical Exam:  Blood pressure (!) 140/101, pulse 76, temperature 98.2 F (36.8 C), temperature source Oral, resp. rate 16, height 6\' 1"  (1.854 m), weight 81.4 kg, SpO2 97 %. Physical Exam Constitutional:      General: He is not in acute distress. HENT:     Head: Normocephalic and atraumatic.  Eyes:     General: Scleral icterus present.     Extraocular Movements: Extraocular movements intact.  Cardiovascular:     Rate and Rhythm: Normal rate.     Pulses: Normal pulses.     Heart sounds: Normal heart sounds.  Pulmonary:     Effort: Pulmonary effort is normal. No respiratory distress.     Breath sounds: Rales (minimal bibasilar crackles) present.  Abdominal:     General: Bowel sounds are normal. There is distension.     Tenderness: There is abdominal  tenderness (RLQ TTP).  Musculoskeletal:        General: No swelling or tenderness.  Skin:    General: Skin is warm.     Findings: No rash.  Neurological:     General: No focal deficit present.     Mental Status: He is alert. Mental status is at baseline.  Psychiatric:        Mood and Affect: Mood normal.    Assessment/Plan:   1. AKI - Patient's Cr today is 10.52 (GFR 6) with worsening electrolyte abnormalities. Specifically his serum Na is 124, CL of 97 and phosphorus of 8.0. Patient is NPO for EGD today. Patient does not have any overt signs of uremia, but would benefit form HD today due to his electrolyte abnormalities.  - HD today after his EGD.  - Continue fluid and salt restriction, daily weights and strict I&Os - Restrict phosphate to 900 mg/day and consider starting calcitriol/phosphate binder  - Consider Iron supplementation and EPO for anemia   2. Acute Liver Failure - Patient denies any further episodes of hematemesis and continues to have a good appetite. Liver function appears to be improving.  - EGD today per GI.  3. Normocytic anemia - Patient has a new onset anemia in the setting of acute liver and Kidney injury. He denies shortness of breath or palpitations but does admit to fatigue. Anemia likely secondary to mixed kidney injury and iron deficiency. - Iron studies, B12 and folate, reticulocyte count.  - Consider iron  supplementation and possibly EPO.     Dellia Cloud, D.O. [facility], PGY-2 Pager: [phone], Phone: [phone] Date 08/05/2020 Time 7:30 AM   Recent Labs  Lab 08/02/20 1600 08/02/20 1600 08/03/20 0429 08/04/20 0303 08/05/20 0418  NA 132*   < > 128* 127* 124*  K 4.5   < > 4.8 4.9 4.7  CL 100   < > 97* 97* 92*  CO2 26   < > 23 21* 21*  GLUCOSE 118*   < > 93 90 91  BUN 17   < > 22* 26* 41*  CREATININE 4.09*   < > 5.69* 7.79* 10.52*  CALCIUM 7.8*   < > 7.9* 7.9* 8.0*  PHOS 2.6  --  4.8*  --  8.0*   < > = values in  this interval not displayed.   Recent Labs  Lab 07/31/20 0223 08/02/20 0423 08/05/20 0418  WBC 5.5 6.1 8.0  HGB 10.5* 11.3* 9.5*  HCT 30.2* 32.3* 27.3*  MCV 92.4 92.0 91.9  PLT 108* 103* 132*

## 2020-08-05 NOTE — Op Note (Signed)
[facility] Patient Name: Paul Reese Procedure Date : 08/05/2020 MRN: [id number] Attending MD: Justice Britain , MD Date of Birth: [birth year] CSN: [id number] Age: 39 Admit Type: Inpatient Procedure:                Upper GI endoscopy Indications:              Coffee-ground emesis, Hematemesis, Recent                            gastrointestinal bleeding, Suspected upper                            gastrointestinal bleeding, Hepatitis with suspected                            esophageal varices Providers:                Justice Britain, MD, Benetta Spar RN, RN, Tyna Jaksch Technician, Laverda Sorenson, Technician Referring MD:             [facility]                           Lajuan Lines. Pyrtle, MD Medicines:                Monitored Anesthesia Care Complications:            No immediate complications. Estimated Blood Loss:     Estimated blood loss was minimal. Procedure:                Pre-Anesthesia Assessment:                           - Prior to the procedure, a History and Physical                            was performed, and patient medications and                            allergies were reviewed. The patient's tolerance of                            previous anesthesia was also reviewed. The risks                            and benefits of the procedure and the sedation                            options and risks were discussed with the patient.                            All questions were answered, and informed consent                            was obtained. Prior Anticoagulants: The patient has  taken no previous anticoagulant or antiplatelet                            agents. ASA Grade Assessment: III - A patient with                            severe systemic disease. After reviewing the risks                            and benefits, the patient was deemed in                             satisfactory condition to undergo the procedure.                           After obtaining informed consent, the endoscope was                            passed under direct vision. Throughout the                            procedure, the patient's blood pressure, pulse, and                            oxygen saturations were monitored continuously. The                            [id number]-H190 ([id number] Olympus gastroscope was                            introduced through the mouth, and advanced to the                            second part of duodenum. The upper GI endoscopy was                            accomplished without difficulty. The patient                            tolerated the procedure. Scope In: Scope Out: Findings:      No gross lesions were noted in the proximal esophagus and in the mid       esophagus.      LA Grade C (one or more mucosal breaks continuous between tops of 2 or       more mucosal folds, less than 75% circumference) esophagitis with no       bleeding was found in the distal esophagus.      One superficial esophageal ulcer with stigmata of recent bleeding was       found in the distal esophagus and this is mostly likely consistent with       a healing MW tear (more so than a viral ulcer). The lesion was 8 mm in       largest dimension.      There is no endoscopic evidence of varices in the entire esophagus.  A 2 cm hiatal hernia was present.      Patchy mild inflammation characterized by erythema and granularity was       found in the gastric body and in the gastric antrum. Biopsies were taken       with a cold forceps for histology and Helicobacter pylori testing.      No gross lesions were noted in the duodenal bulb, in the first portion       of the duodenum and in the second portion of the duodenum. Impression:               - No gross lesions in esophagus proximally.                           - Distal esophagitis present.                           -  Esophageal ulcer with healing and recent evidence                            of likely stigmata of bleeding - likely in setting                            of healing MW tear.                           - Currently no varices.                           - Small Hiatal Hernia.                           - Gastritis. Biopsied.                           - No gross lesions in the duodenal bulb, in the                            first portion of the duodenum and in the second                            portion of the duodenum. Recommendation:           - The patient will be observed post-procedure,                            until all discharge criteria are met.                           - Return patient to hospital ward for ongoing care.                           - Observe patient's clinical course.                           - Await pathology results.                           -  Continue PPI BID for 74-month. Then maintain                            until follow EGD is performed to ensure healing and                            then may start decreasing.                           - Complete alcohol abstinence is key.                           - Patient will need follow up at time of discharge                            with Liver team (Dr. PHilarie Fredricksonor myself as long as                            the patient agrees to this) for continued                            evaluation and treatment of his alcoholic liver                            disease. We will place a follow up appointment.                            Periodic following of LFTs by the patient's PCP can                            be arranged in the interim.                           - Continue supportive measures and hopeful for                            continued renal recovery. The GI Inpatient service                            will signoff but will be happy to be available if                            needed in the future should he  remain hospitalized.                           - The findings and recommendations were discussed                            with the patient.                           - The findings and recommendations were discussed  with the referring physician. Procedure Code(s):        --- Professional ---                           [phone], Esophagogastroduodenoscopy, flexible,                            transoral; with biopsy, single or multiple Diagnosis Code(s):        --- Professional ---                           K22.11, Ulcer of esophagus with bleeding                           K29.70, Gastritis, unspecified, without bleeding                           K92.0, Hematemesis                           K92.2, Gastrointestinal hemorrhage, unspecified                           K75.9, Inflammatory liver disease, unspecified CPT copyright 2019 American Medical Association. All rights reserved. The codes documented in this report are preliminary and upon coder review may  be revised to meet current compliance requirements. Justice Britain, MD 08/05/2020 10:02:03 AM Number of Addenda: 0

## 2020-08-06 ENCOUNTER — Telehealth: Payer: Self-pay

## 2020-08-06 ENCOUNTER — Other Ambulatory Visit: Payer: Self-pay

## 2020-08-06 DIAGNOSIS — K7200 Acute and subacute hepatic failure without coma: Secondary | ICD-10-CM

## 2020-08-06 DIAGNOSIS — R7989 Other specified abnormal findings of blood chemistry: Secondary | ICD-10-CM

## 2020-08-06 LAB — COMPREHENSIVE METABOLIC PANEL
ALT: 308 U/L — ABNORMAL HIGH (ref 0–44)
AST: 46 U/L — ABNORMAL HIGH (ref 15–41)
Albumin: 2.2 g/dL — ABNORMAL LOW (ref 3.5–5.0)
Alkaline Phosphatase: 164 U/L — ABNORMAL HIGH (ref 38–126)
Anion gap: 8 (ref 5–15)
BUN: 29 mg/dL — ABNORMAL HIGH (ref 6–20)
CO2: 26 mmol/L (ref 22–32)
Calcium: 7.7 mg/dL — ABNORMAL LOW (ref 8.9–10.3)
Chloride: 94 mmol/L — ABNORMAL LOW (ref 98–111)
Creatinine, Ser: 8.32 mg/dL — ABNORMAL HIGH (ref 0.61–1.24)
GFR calc Af Amer: 8 mL/min — ABNORMAL LOW (ref >60)
GFR calc non Af Amer: 7 mL/min — ABNORMAL LOW (ref >60)
Glucose, Bld: 97 mg/dL (ref 70–99)
Potassium: 4.3 mmol/L (ref 3.5–5.1)
Sodium: 128 mmol/L — ABNORMAL LOW (ref 135–145)
Total Bilirubin: 5.1 mg/dL — ABNORMAL HIGH (ref 0.3–1.2)
Total Protein: 5.4 g/dL — ABNORMAL LOW (ref 6.5–8.1)

## 2020-08-06 LAB — CBC
HCT: 26.6 % — ABNORMAL LOW (ref 39.0–52.0)
Hemoglobin: 9.2 g/dL — ABNORMAL LOW (ref 13.0–17.0)
MCH: 32.6 pg (ref 26.0–34.0)
MCHC: 34.6 g/dL (ref 30.0–36.0)
MCV: 94.3 fL (ref 80.0–100.0)
Platelets: 177 10*3/uL (ref 150–400)
RBC: 2.82 MIL/uL — ABNORMAL LOW (ref 4.22–5.81)
RDW: 14.6 % (ref 11.5–15.5)
WBC: 7.8 10*3/uL (ref 4.0–10.5)
nRBC: 0 % (ref 0.0–0.2)

## 2020-08-06 LAB — PROTIME-INR
INR: 1.1 (ref 0.8–1.2)
Prothrombin Time: 13.6 seconds (ref 11.4–15.2)

## 2020-08-06 LAB — PHOSPHORUS: Phosphorus: 7.8 mg/dL — ABNORMAL HIGH (ref 2.5–4.6)

## 2020-08-06 LAB — AMMONIA: Ammonia: 30 umol/L (ref 9–35)

## 2020-08-06 LAB — PARATHYROID HORMONE, INTACT (NO CA): PTH: 87 pg/mL — ABNORMAL HIGH (ref 15–65)

## 2020-08-06 LAB — MAGNESIUM: Magnesium: 2.3 mg/dL (ref 1.7–2.4)

## 2020-08-06 LAB — HEPATITIS B SURFACE ANTIBODY, QUANTITATIVE: Hep B S AB Quant (Post): 20.4 m[IU]/mL (ref >9.9)

## 2020-08-06 LAB — SURGICAL PATHOLOGY

## 2020-08-06 LAB — APTT: aPTT: 34 seconds (ref 24–36)

## 2020-08-06 MED ORDER — DIPHENHYDRAMINE HCL 25 MG PO CAPS
25.0000 mg | ORAL_CAPSULE | Freq: Four times a day (QID) | ORAL | Status: DC | PRN
  Administered 2020-08-06 – 2020-08-12 (×8): 25 mg via ORAL
  Filled 2020-08-06 (×9): qty 1

## 2020-08-06 MED ORDER — HYDROMORPHONE HCL 1 MG/ML IJ SOLN
0.5000 mg | Freq: Once | INTRAMUSCULAR | Status: AC
  Filled 2020-08-06: qty 1

## 2020-08-06 NOTE — Progress Notes (Signed)
[facility] Progress Note   The patient is a 39 y.o. year-old w/ hx tobacco use, RLS, hx cocaine and etoh abuse presented to ED at [facility] w/ abd and groin pay for a few days. Hx etoh abuse, +vomiting at home, no fever or diarrhea. CT scan showed possible focal colitis, no ureteral stones, some edema of GB wall. LFT's came back very abnormal, tbili 5, AST > 12000 and ALT 8300, creat 2.26.  Pt had been taking excessive amts of tylenol for those few days for his pain. He was started on IV NEC. He was not felt to be candidate by [facility] due to +etoh level, pt's INR 2.5.  Pt was admitted to [facility] ICU. Asked to see for renal failure.   Assessment/ Plan:   1.AKI 2/2 to ATN -Patient's Cr has decreased from 10.52 to 8.32 since yesterday. Patient states that he tolerated HD well yesterday, but complaints of generalized pruritis today like due to his renal and liver dysfunction. Patient had 725 cc of urine output yesterday. Patient is making slow progress and will likely need to be discharged home with HD.  - Will sleekly need repeat HD tomorrow.  - Continue fluid and salt restricted diet, daily weights and strict I&O. - Consult IR for tunnel cath placement. - Will begin the process of setting up outpatient HD  2. Hyperphosphatemia - secondary to renal insufficiency. PTH elevated and currently on calcium carbonate.  - Continue calcium carbonate - Consider vitamin D supplementation. - Continue phosphate restricted diet   3. Hyponatremia - minimally improved with dialysis. Patient denies symptoms. Hyp[onatreamia secondary to Renal and hepatic dysfunction. Although he is not overtly volume overloaded on exam, but does have imaging showing ascites.  - Continue to monitor closely - He will likely need HD tomorrow.  2.Acute Liver Failure - Patient denies any further episodes of hematemesis and continues to have a good appetite. Liver function appears to be improving. EGD shows evidence of esophagitis  and MW tear.   3. Normocytic, hypoproliferative anemia - Patient has a reticulocyte index of 1.69 indicating hypoproliferative state. Iron studies indicated anemia 2/2 to CKD. He may benefit form EPO supplementation.   Subjective:     Patient is resting comfortably in bed. He states that his EGD and HD session yesterday went well. He feel less fatigued today, stating that he was able to get up and walk around the room and take himself to the bathroom. He does admit to diffuse pruritis that he describes as feels like he needs to scratch through his skin" Otherwise, he denies change in taste, nausea, vomiting or chang appetite. He continues to have good urine output and has not seen any hematuria.    Objective:   BP (!) 152/96 (BP Location: Right Arm)   Pulse 75   Temp 98.1 F (36.7 C) (Oral)   Resp 18   Ht 6\' 1"  (1.854 m)   Wt 75.6 kg   SpO2 99%   BMI 21.99 kg/m   Intake/Output Summary (Last 24 hours) at 08/06/2020 0723 Last data filed at 08/06/2020 0510 Gross per 24 hour  Intake 460 ml  Output 2225 ml  Net -1765 ml   Weight change: -4.288 kg  Physical Exam: Physical Exam Constitutional:      General: He is not in acute distress.    Appearance: He is not diaphoretic.  HENT:     Head: Normocephalic and atraumatic.  Eyes:     General: Scleral icterus present.  Cardiovascular:  Rate and Rhythm: Normal rate.     Heart sounds: Normal heart sounds.  Pulmonary:     Effort: Pulmonary effort is normal. No respiratory distress.     Breath sounds: Normal breath sounds.  Abdominal:     General: Bowel sounds are normal. There is distension (minimal RLQ tenderness, improved form yesterday.).  Genitourinary:    Testes:        Right: Swelling not present.        Left: Swelling not present.  Skin:    General: Skin is warm and dry.  Neurological:     General: No focal deficit present.     Mental Status: He is alert.  Psychiatric:        Mood and Affect: Mood normal.      Imaging: No results found.  Labs: BMET Recent Labs  Lab 08/01/20 0413 08/01/20 0413 08/01/20 1600 08/02/20 0423 08/02/20 1600 08/03/20 0429 08/04/20 0303 08/05/20 0418 08/06/20 0331  NA 134*   < > 132* 133*  127* 132* 128* 127* 124* 128*  K 4.0   < > 4.2 4.3  4.0 4.5 4.8 4.9 4.7 4.3  CL 99   < > 98 99  96* 100 97* 97* 92* 94*  CO2 23   < > 26 25  23 26 23  21* 21* 26  GLUCOSE 104*   < > 109* 128*  125* 118* 93 90 91 97  BUN 29*   < > 24* 20  20 17  22* 26* 41* 29*  CREATININE 6.20*   < > 5.57* 4.73*  4.71* 4.09* 5.69* 7.79* 10.52* 8.32*  CALCIUM 7.9*   < > 7.8* 7.7*  7.5* 7.8* 7.9* 7.9* 8.0* 7.7*  PHOS 2.8  --  3.3 3.5  3.3 2.6 4.8*  --  8.0* 7.8*   < > = values in this interval not displayed.   CBC Recent Labs  Lab 07/31/20 0223 08/02/20 0423 08/05/20 0418 08/06/20 0331  WBC 5.5 6.1 8.0 7.8  HGB 10.5* 11.3* 9.5* 9.2*  HCT 30.2* 32.3* 27.3* 26.6*  MCV 92.4 92.0 91.9 94.3  PLT 108* 103* 132* 177    Medications:    . Chlorhexidine Gluconate Cloth  6 each Topical Q0600  . feeding supplement  1 Container Oral BID BM  . pantoprazole  40 mg Oral BID  . sodium chloride flush  10-40 mL Intracatheter Q12H      08/07/20, D.O. [facility] Internal Medicine, PGY-2 Pager: [phone], Phone: [phone] Date 08/06/2020 Time 2:25 PM

## 2020-08-06 NOTE — Plan of Care (Signed)
  Problem: Clinical Measurements: Goal: Will remain free from infection Outcome: Adequate for Discharge   
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
## 2020-08-06 NOTE — Progress Notes (Signed)
@   20:00 Patient  c/o of severe abdominal RLQ pain,crying, 9/10 pain score  after eating his fruit platter, skittles and booze juice.was given  dilaudid .5mg  earlier @19 :00. On call provider MD Chotiner made aware, order was given, to give dilaudid .5mg  once

## 2020-08-06 NOTE — Telephone Encounter (Signed)
Pt scheduled to see Doug Sou PA 09/11/20@2pm , lab orders in epic. Appt letter mailed to pt.

## 2020-08-06 NOTE — Progress Notes (Signed)
PROGRESS NOTE  Paul Reese:[id number] DOB: [birth year] DOA: 07/28/2020 PCP: Pennie Banter, MD   LOS: 9 days   Brief narrative: 38/M with history of alcohol and polysubstance abuse presented to the ED with unintentional Tylenol overdose.  In addition also is drinking 6 to 12 pack of beer every day -Presented to the ED on 8/8 with abdominal pain, cramps and discomfort -The emergency room was noted to have abnormal LFTs with AST and ALT greater than 10,000, in addition, bilirubin was 5.6 creatinine was 2.2 ammonia level is 40, platelet count was 90 5K and INR was 2.5 -Resident admitted with acute liver injury from Tylenol toxicity and ATN likely from hyper bilirubinemia -Treated with N acetylcysteine per poison control protocol in the ICU, [facility] transplant team was consulted on 8/9, not felt to be appropriate/candidate for transplant -Had progressive worsening of kidney function, treated with CRRT in the ICU then transition to intermittent HD, urine output slowly improving  Assessment/Plan:  Acute liver injury  Unintentional Tylenol overdose  Alcoholic liver disease  -Acute infectious hepatitis panel was negative on admission -Declined by [facility] transplant team -Treated with NAC -LFTs improving, continue to trend -Alcohol cessation counseled  Acute kidney injury/ATN -Suspected to be secondary to acute liver failure, hyperbilirubinemia, creatinine progressively worsened from 2.2 on admission up to 9.0, baseline creatinine is 1.0 from 07/09/2020 -Started on CRRT in the ICU, now on intermittent hemodialysis -Last HD was 8/17, urine output of 750 mL in past 24 hours -Hopeful for renal recovery  Nausea vomiting Gastritis/esophageal ulcer -Noted on endoscopy 8/17, felt to be consistent with healing Mallory-Weiss tear -Continue PPI -Follow-up with gastroenterology as outpatient for alcoholic liver disease and above  Acute hypoxic failure has improved.  Currently on room  air.  Metabolic encephalopathy improving.  Hyponatremia.   -Improving  Normocytic anemia:  -Stable, monitor   Substance use disorder: history of ETOH and cocaine use. Presented with elevated ETOH but negative UDS.  DVT prophylaxis: SCDs Start: 07/29/20 1218  Code Status: Full code   Family Communication: None  Status is: Inpatient  Remains inpatient appropriate because:IV treatments appropriate due to intensity of illness or inability to take PO and Inpatient level of care appropriate due to severity of illness, plan for hemodialysis.     Dispo: The patient is from: Home              Anticipated d/c is to: Home              Anticipated d/c date is: hemodialysis requirement, follow nephrology recommendation.              Patient currently is not medically stable to d/c.   Consultants:  GI  PCCM  Nephrology  Procedures: 8/12 Started CRRT 8/14 changed to CRRT 12h / day  Right internal jugular hemodialysis catheter placement Foley catheter placement Upper GI endoscopy on 08/05/2020 Hemodialysis planned for 08/05/2020  Antibiotics:  . None  Anti-infectives (From admission, onward)   None      Subjective: -Dialyzed yesterday, feels a little better overall, making some urine  -Breathing better, still has mild right-sided abdominal discomfort, no nausea or vomiting, finished all his breakfast this morning     Objective: Vitals:   08/06/20 0510 08/06/20 0905  BP: (!) 152/96 (!) 149/100  Pulse: 75 74  Resp: 18 18  Temp: 98.1 F (36.7 C) 98.1 F (36.7 C)  SpO2: 99% 94%    Intake/Output Summary (Last 24 hours) at 08/06/2020 1421 Last data filed at  08/06/2020 1300 Gross per 24 hour  Intake 860 ml  Output 2225 ml  Net -1365 ml   Filed Weights   08/05/20 1356 08/05/20 1710 08/05/20 2107  Weight: 77.1 kg 75.6 kg 75.6 kg   Body mass index is 21.99 kg/m.   Physical Exam: Pleasant young male sitting up in bed AAOx3, no distress HEENT: Right IJ HD  catheter noted CVS: S1-S2, regular rate rhythm Lungs: Few basilar rales Abdomen: Soft, mild right upper quadrant tenderness, bowel sounds present, GU Foley catheter noted  Extremities: No edema   Data Review: I have personally reviewed the following laboratory data and studies,  CBC: Recent Labs  Lab 07/31/20 0223 08/02/20 0423 08/05/20 0418 08/06/20 0331  WBC 5.5 6.1 8.0 7.8  HGB 10.5* 11.3* 9.5* 9.2*  HCT 30.2* 32.3* 27.3* 26.6*  MCV 92.4 92.0 91.9 94.3  PLT 108* 103* 132* 177   Basic Metabolic Panel: Recent Labs  Lab 08/02/20 0423 08/02/20 0423 08/02/20 1600 08/03/20 0429 08/04/20 0303 08/05/20 0418 08/06/20 0331  NA 133*  127*   < > 132* 128* 127* 124* 128*  K 4.3  4.0   < > 4.5 4.8 4.9 4.7 4.3  CL 99  96*   < > 100 97* 97* 92* 94*  CO2 25  23   < > 26 23 21* 21* 26  GLUCOSE 128*  125*   < > 118* 93 90 91 97  BUN 20  20   < > 17 22* 26* 41* 29*  CREATININE 4.73*  4.71*   < > 4.09* 5.69* 7.79* 10.52* 8.32*  CALCIUM 7.7*  7.5*   < > 7.8* 7.9* 7.9* 8.0* 7.7*  MG 2.3  --   --  2.4 2.4 2.6* 2.3  PHOS 3.5  3.3  --  2.6 4.8*  --  8.0* 7.8*   < > = values in this interval not displayed.   Liver Function Tests: Recent Labs  Lab 08/02/20 0423 08/02/20 0423 08/02/20 1600 08/03/20 0429 08/04/20 0303 08/05/20 0418 08/06/20 0331  AST 320*  --   --  157* 99* 61* 46*  ALT 1,183*  --   --  798* 579* 438* 308*  ALKPHOS 180*  --   --  184* 187* 177* 164*  BILITOT 12.1*  --   --  9.3* 7.9* 7.0* 5.1*  PROT 5.0*  --   --  5.0* 5.1* 5.3* 5.4*  ALBUMIN 2.4*  2.3*   < > 2.4* 2.4*  2.3* 2.1* 2.3* 2.2*   < > = values in this interval not displayed.   No results for input(s): LIPASE, AMYLASE in the last 168 hours. Recent Labs  Lab 08/02/20 1000 08/03/20 0429 08/04/20 0303 08/05/20 0418 08/06/20 0331  AMMONIA 34 40* 46* 33 30   Cardiac Enzymes: No results for input(s): CKTOTAL, CKMB, CKMBINDEX, TROPONINI in the last 168 hours. BNP (last 3 results) No  results for input(s): BNP in the last 8760 hours.  ProBNP (last 3 results) No results for input(s): PROBNP in the last 8760 hours.  CBG: No results for input(s): GLUCAP in the last 168 hours. Recent Results (from the past 240 hour(s))  SARS Coronavirus 2 by RT PCR (hospital order, performed in [facility] hospital lab) Nasopharyngeal Nasopharyngeal Swab     Status: None   Collection Time: 07/28/20  5:46 PM   Specimen: Nasopharyngeal Swab  Result Value Ref Range Status   SARS Coronavirus 2 NEGATIVE NEGATIVE Final    Comment: (NOTE) SARS-CoV-2 target nucleic  acids are NOT DETECTED.  The SARS-CoV-2 RNA is generally detectable in upper and lower respiratory specimens during the acute phase of infection. The lowest concentration of SARS-CoV-2 viral copies this assay can detect is 250 copies / mL. A negative result does not preclude SARS-CoV-2 infection and should not be used as the sole basis for treatment or other patient management decisions.  A negative result may occur with improper specimen collection / handling, submission of specimen other than nasopharyngeal swab, presence of viral mutation(s) within the areas targeted by this assay, and inadequate number of viral copies (<250 copies / mL). A negative result must be combined with clinical observations, patient history, and epidemiological information.  Fact Sheet for Patients:   BoilerBrush.com.cy  Fact Sheet for Healthcare Providers: https://pope.com/  This test is not yet approved or  cleared by the [place] FDA and has been authorized for detection and/or diagnosis of SARS-CoV-2 by FDA under an Emergency Use Authorization (EUA).  This EUA will remain in effect (meaning this test can be used) for the duration of the COVID-19 declaration under Section 564(b)(1) of the Act, 21 U.S.C. section 360bbb-3(b)(1), unless the authorization is terminated or revoked  sooner.  Performed at [facility], [address], [place], [place] [address]   MRSA PCR Screening     Status: Abnormal   Collection Time: 07/29/20 12:30 PM   Specimen: Nasal Mucosa; Nasopharyngeal  Result Value Ref Range Status   MRSA by PCR POSITIVE (A) NEGATIVE Final    Comment:        The GeneXpert MRSA Assay (FDA approved for NASAL specimens only), is one component of a comprehensive MRSA colonization surveillance program. It is not intended to diagnose MRSA infection nor to guide or monitor treatment for MRSA infections. RESULT CALLED TO, READ BACK BY AND VERIFIED WITH: SEGERS,H. RN @1510  ON 08.10.2021 BY COHEN,K Performed at [facility], [address], [place], [place]      Studies: No results found.  [id number], MD  [facility] 08/06/2020

## 2020-08-06 NOTE — Telephone Encounter (Signed)
-----   Message from Lemar Lofty., MD sent at 08/05/2020 10:02 AM EDT ----- Regarding: Follow up Paul Reese, This patient needs a follow up with JMP or myself in about 4-weeks. OK for him to be seen by APP if necessary. LFTs and INR should be obtained in 2-weeks upon discharge (he is still admitted). Whomever, he is set up with needs those to come to them. He will also need a repeat EGD in 36-months to ensure healing of esophageal ulcer/MW tear noted. Thanks. GM

## 2020-08-07 ENCOUNTER — Encounter (HOSPITAL_COMMUNITY): Payer: Self-pay | Admitting: Gastroenterology

## 2020-08-07 LAB — COMPREHENSIVE METABOLIC PANEL
ALT: 227 U/L — ABNORMAL HIGH (ref 0–44)
AST: 38 U/L (ref 15–41)
Albumin: 2.2 g/dL — ABNORMAL LOW (ref 3.5–5.0)
Alkaline Phosphatase: 145 U/L — ABNORMAL HIGH (ref 38–126)
Anion gap: 11 (ref 5–15)
BUN: 40 mg/dL — ABNORMAL HIGH (ref 6–20)
CO2: 23 mmol/L (ref 22–32)
Calcium: 7.9 mg/dL — ABNORMAL LOW (ref 8.9–10.3)
Chloride: 92 mmol/L — ABNORMAL LOW (ref 98–111)
Creatinine, Ser: 10.94 mg/dL — ABNORMAL HIGH (ref 0.61–1.24)
GFR calc Af Amer: 6 mL/min — ABNORMAL LOW (ref >60)
GFR calc non Af Amer: 5 mL/min — ABNORMAL LOW (ref >60)
Glucose, Bld: 98 mg/dL (ref 70–99)
Potassium: 4.9 mmol/L (ref 3.5–5.1)
Sodium: 126 mmol/L — ABNORMAL LOW (ref 135–145)
Total Bilirubin: 4.7 mg/dL — ABNORMAL HIGH (ref 0.3–1.2)
Total Protein: 5.4 g/dL — ABNORMAL LOW (ref 6.5–8.1)

## 2020-08-07 LAB — APTT: aPTT: 34 seconds (ref 24–36)

## 2020-08-07 LAB — CBC
HCT: 24.6 % — ABNORMAL LOW (ref 39.0–52.0)
Hemoglobin: 8.5 g/dL — ABNORMAL LOW (ref 13.0–17.0)
MCH: 32.7 pg (ref 26.0–34.0)
MCHC: 34.6 g/dL (ref 30.0–36.0)
MCV: 94.6 fL (ref 80.0–100.0)
Platelets: 182 10*3/uL (ref 150–400)
RBC: 2.6 MIL/uL — ABNORMAL LOW (ref 4.22–5.81)
RDW: 14.5 % (ref 11.5–15.5)
WBC: 8.8 10*3/uL (ref 4.0–10.5)
nRBC: 0 % (ref 0.0–0.2)

## 2020-08-07 LAB — PROTIME-INR
INR: 1 (ref 0.8–1.2)
Prothrombin Time: 13.2 seconds (ref 11.4–15.2)

## 2020-08-07 LAB — AMMONIA: Ammonia: 31 umol/L (ref 9–35)

## 2020-08-07 MED ORDER — HYDROMORPHONE HCL 1 MG/ML IJ SOLN
INTRAMUSCULAR | Status: AC
  Administered 2020-08-07: 0.5 mg
  Filled 2020-08-07: qty 0.5

## 2020-08-07 MED ORDER — SODIUM CHLORIDE 0.9 % IV SOLN: 100.0000 mL | INTRAVENOUS | Status: DC | PRN

## 2020-08-07 MED ORDER — CEFAZOLIN SODIUM-DEXTROSE 2-4 GM/100ML-% IV SOLN: 2.0000 g | INTRAVENOUS | Status: AC

## 2020-08-07 MED ORDER — LIDOCAINE-PRILOCAINE 2.5-2.5 % EX CREA: 1.0000 "application " | TOPICAL_CREAM | CUTANEOUS | Status: DC | PRN

## 2020-08-07 MED ORDER — ALTEPLASE 2 MG IJ SOLR: 2.0000 mg | Freq: Once | INTRAMUSCULAR | Status: DC | PRN

## 2020-08-07 MED ORDER — SODIUM CHLORIDE 0.9 % IV SOLN
250.0000 mg | Freq: Every day | INTRAVENOUS | Status: AC
  Administered 2020-08-07 – 2020-08-10 (×4): 250 mg via INTRAVENOUS
  Filled 2020-08-07 (×4): qty 20

## 2020-08-07 MED ORDER — MAGNESIUM CITRATE PO SOLN
1.0000 | Freq: Once | ORAL | Status: AC
  Administered 2020-08-07: 1 via ORAL
  Filled 2020-08-07: qty 296

## 2020-08-07 MED ORDER — LIDOCAINE HCL (PF) 1 % IJ SOLN: 5.0000 mL | INTRAMUSCULAR | Status: DC | PRN

## 2020-08-07 MED ORDER — AMLODIPINE BESYLATE 5 MG PO TABS
5.0000 mg | ORAL_TABLET | Freq: Every day | ORAL | Status: DC
  Administered 2020-08-07 – 2020-08-14 (×8): 5 mg via ORAL
  Filled 2020-08-07 (×10): qty 1

## 2020-08-07 MED ORDER — HEPARIN SODIUM (PORCINE) 1000 UNIT/ML IJ SOLN
INTRAMUSCULAR | Status: AC
  Administered 2020-08-07: 1000 [IU]
  Filled 2020-08-07: qty 4

## 2020-08-07 MED ORDER — HEPARIN SODIUM (PORCINE) 1000 UNIT/ML DIALYSIS: 1000.0000 [IU] | INTRAMUSCULAR | Status: DC | PRN

## 2020-08-07 MED ORDER — PENTAFLUOROPROP-TETRAFLUOROETH EX AERO: 1.0000 "application " | INHALATION_SPRAY | CUTANEOUS | Status: DC | PRN

## 2020-08-07 NOTE — Progress Notes (Signed)
[facility] Progress Note  Admit: 07/28/2020 LOS: 10  Subjective:  Patient has had worsening abdominal pain over the past 24 hours.  He has noticed that his abdomen is slightly distended and the pain moves around it is sharp.  He has not had a bowel movement in 3 days.   08/18 0701 - 08/19 0700 In: 1374 [P.O.:900] Out: 850 [Urine:850]  Filed Weights   08/05/20 1710 08/05/20 2107 08/06/20 2107  Weight: 75.6 kg 75.6 kg 75.6 kg    Scheduled Meds: . amLODipine  5 mg Oral Daily  . Chlorhexidine Gluconate Cloth  6 each Topical Q0600  . feeding supplement  1 Container Oral BID BM  . magnesium citrate  1 Bottle Oral Once  . pantoprazole  40 mg Oral BID  . sodium chloride flush  10-40 mL Intracatheter Q12H   Continuous Infusions: . sodium chloride    .  ceFAZolin (ANCEF) IV     PRN Meds:.sodium chloride, calcium carbonate, diphenhydrAMINE, docusate sodium, hydrocortisone, HYDROmorphone (DILAUDID) injection, labetalol, lip balm, ondansetron (ZOFRAN) IV, polyethylene glycol, sodium chloride flush   Physical Exam:  Blood pressure (!) 140/99, pulse 77, temperature 99 F (37.2 C), temperature source Oral, resp. rate 16, height 6\' 1"  (1.854 m), weight 75.6 kg, SpO2 91 %. GEN: wdwn, sitting in bed, mild distress ENT: no nasal discharge, mmm EYES: Scleral icterus present, no conjunctival injection CV: normal rate, no murmurs PULM: no iwob, bilateral chest rise ABD: NABS, mild distention SKIN: no rashes, minimal jaundice EXT: no edema, warm and well perfused  Assessment/Plan:   1.   Severe dialysis dependent AKI - acute kidney injury secondary to Tylenol toxicity causing ATN.  Presumed normal baseline kidney function.  Urine output has remained intact but difficulty with clearance requiring ongoing dialysis currently on TTS schedule.  We will plan for dialysis today as well as tunneled dialysis catheter.  Unfortunately the patient does not have insurance so he will not be  able to be set up for outpatient dialysis.  With time he is expected to recover his kidney function -Hemodialysis today, continue TTS schedule - Continue fluid and salt restriction, daily weights and strict I&Os -IR for TDC, appreciate help -Monitor closely for recovery -Continue phosphorus restriction - Consider Iron supplementation and EPO for anemia   2. Acute Liver Failure -GI and primary team managing.  Seems to be improving - EGD today per GI.  3. Normocytic anemia - likely multifactorial with some GI blood loss related to inflamed gastric mucosa on the EGD.  Iron saturation 23 with ferritin of 195.  Will supplement IV iron at this time 1000mg   4. Hypertension: Start Norvasc 5 mg daily  5. Abdominal Pain: Starting yesterday and continuing.  Not relieved by Dilaudid.  Mild distention of abdomen.  He may be constipated given he has not had a bowel movement recently.  We will give him mag citrate today  6. Hyponatremia: 126 today.  Likely related to free water retention.  Continue volume removal with dialysis.   Recent Labs  Lab 08/03/20 0429 08/04/20 0303 08/05/20 0418 08/06/20 0331 08/07/20 0442  NA 128*   < > 124* 128* 126*  K 4.8   < > 4.7 4.3 4.9  CL 97*   < > 92* 94* 92*  CO2 23   < > 21* 26 23  GLUCOSE 93   < > 91 97 98  BUN 22*   < > 41* 29* 40*  CREATININE 5.69*   < > 10.52* 8.32*  10.94*  CALCIUM 7.9*   < > 8.0* 7.7* 7.9*  PHOS 4.8*  --  8.0* 7.8*  --    < > = values in this interval not displayed.   Recent Labs  Lab 08/05/20 0418 08/06/20 0331 08/07/20 0442  WBC 8.0 7.8 8.8  HGB 9.5* 9.2* 8.5*  HCT 27.3* 26.6* 24.6*  MCV 91.9 94.3 94.6  PLT 132* 177 182

## 2020-08-07 NOTE — Plan of Care (Signed)
  Problem: Pain Managment: Goal: General experience of comfort will improve Outcome: Progressing   Problem: Elimination: Goal: Will not experience complications related to bowel motility Outcome: Progressing   Problem: Activity: Goal: Risk for activity intolerance will decrease Outcome: Progressing   

## 2020-08-07 NOTE — Progress Notes (Signed)
Renal Navigator received notification from Nephrologist to refer patient for OP HD treatment for AKI. Navigator notes that patient is uninsured and discussed with [facility] Admissions. Patient will not be financially cleared as an AKI for OP HD treatment unless he has insurance in place (not pending), or a letter of guarantee from the hospital to pay for his treatments. Navigator has updated Nephrologist, Attending and TOC.   Barbee [place], [place] Renal Navigator [phone]

## 2020-08-07 NOTE — Progress Notes (Signed)
PROGRESS NOTE  Paul Reese CBJ:[id number] DOB: 1981-04-11 DOA: 07/28/2020 PCP: Pennie Banter, MD   LOS: 10 days   Brief narrative: 38/M with history of alcohol and polysubstance abuse presented to the ED with unintentional Tylenol overdose.  In addition also is drinking 6 to 12 pack of beer every day -Presented to the ED on 8/8 with abdominal pain, cramps and discomfort -The emergency room was noted to have abnormal LFTs with AST and ALT greater than 10,000, in addition, bilirubin was 5.6 creatinine was 2.2 ammonia level is 40, platelet count was 90 5K and INR was 2.5 -Resident admitted with acute liver injury from Tylenol toxicity and ATN likely from hyper bilirubinemia -Treated with N acetylcysteine per poison control protocol in the ICU, [facility] transplant team was consulted on 8/9, not felt to be appropriate/candidate for transplant -Had progressive worsening of kidney function, treated with CRRT in the ICU then transition to intermittent HD, urine output slowly improving  Assessment/Plan:  Acute liver injury  Unintentional Tylenol overdose  Alcoholic liver disease  -Acute infectious hepatitis panel was negative on admission -Declined by [facility] transplant team -Treated with NAC -LFTs improving, continue to trend -Alcohol cessation counseled  Abd pain -pt reports increasing discomfort in R abd -LFTS, bili improving -agree with trial of laxatives, does have some ascites too -will consider noncontrast CT abd/pelvis if worsens or no change  Acute kidney injury/ATN -Suspected to be secondary to acute liver failure, hyperbilirubinemia, creatinine progressively worsened from 2.2 on admission up to 9/10, baseline creatinine is 1.0 from 07/09/2020 in care everywhere -Started on CRRT in the ICU, now on intermittent hemodialysis -Last HD was 8/17, urine output of 850 mL in past 24 hours -Hopeful for renal recovery, renal plans tunneled HD cath, IR consulting   Nausea  vomiting Gastritis/esophageal ulcer -Noted on endoscopy 8/17, felt to be consistent with healing Mallory-Weiss tear -Continue PPI -Follow-up with gastroenterology as outpatient for alcoholic liver disease and above  Acute hypoxic failure has improved.  Currently on room air.  Metabolic encephalopathy improving.  Hyponatremia.   -Improving  Normocytic anemia:  -Stable, monitor   Substance use disorder: history of ETOH and cocaine use. Presented with elevated ETOH but negative UDS.  DVT prophylaxis: SCDs Start: 07/29/20 1218  Code Status: Full code   Family Communication: None  Status is: Inpatient  Remains inpatient appropriate because:IV treatments appropriate due to intensity of illness or inability to take PO and Inpatient level of care appropriate due to severity of illness, plan for hemodialysis.     Dispo: The patient is from: Home              Anticipated d/c is to: Home              Anticipated d/c date is: hemodialysis requirement, follow nephrology recommendations              Patient currently is not medically stable to d/c.   Consultants:  GI  PCCM  Nephrology  Procedures: 8/12 Started CRRT 8/14 changed to CRRT 12h / day  Right internal jugular hemodialysis catheter placement Foley catheter placement Upper GI endoscopy on 08/05/2020 Hemodialysis planned for 08/05/2020  Antibiotics:   None  Anti-infectives (From admission, onward)   Start     Dose/Rate Route Frequency Ordered Stop   08/07/20 0600  ceFAZolin (ANCEF) IVPB 2g/100 mL premix        2 g 200 mL/hr over 30 Minutes Intravenous To Radiology 08/07/20 0352 08/08/20 0600  Subjective: -c/o R sided abd pain, no vomiting, some nausea, hungry and wants to eat -mild dyspneA  Objective: Vitals:   08/07/20 0619 08/07/20 0841  BP: (!) 153/98 (!) 140/99  Pulse: 78 77  Resp: 18 16  Temp: 99.1 F (37.3 C) 99 F (37.2 C)  SpO2: 92% 91%    Intake/Output Summary (Last 24 hours) at  08/07/2020 1234 Last data filed at 08/07/2020 1100 Gross per 24 hour  Intake 837 ml  Output 1150 ml  Net -313 ml   Filed Weights   08/05/20 1710 08/05/20 2107 08/06/20 2107  Weight: 75.6 kg 75.6 kg 75.6 kg   Body mass index is 21.99 kg/m.   Physical Exam: Gen: pleasant young male, sitting in bed, uncomfortable appearing, AAOx3 HEENT: R IJ HD cath noted, + icterus CVS; S1S2, RRR Lungs: basilar rales Abd: soft, mild RUQ tenterness, BS present, GU-foley Ext: no edema  Data Review: I have personally reviewed the following laboratory data and studies,  CBC: Recent Labs  Lab 08/02/20 0423 08/05/20 0418 08/06/20 0331 08/07/20 0442  WBC 6.1 8.0 7.8 8.8  HGB 11.3* 9.5* 9.2* 8.5*  HCT 32.3* 27.3* 26.6* 24.6*  MCV 92.0 91.9 94.3 94.6  PLT 103* 132* 177 182   Basic Metabolic Panel: Recent Labs  Lab 08/02/20 0423 08/02/20 0423 08/02/20 1600 08/02/20 1600 08/03/20 0429 08/04/20 0303 08/05/20 0418 08/06/20 0331 08/07/20 0442  NA 133*   127*   < > 132*   < > 128* 127* 124* 128* 126*  K 4.3   4.0   < > 4.5   < > 4.8 4.9 4.7 4.3 4.9  CL 99   96*   < > 100   < > 97* 97* 92* 94* 92*  CO2 25   23   < > 26   < > 23 21* 21* 26 23  GLUCOSE 128*   125*   < > 118*   < > 93 90 91 97 98  BUN 20   20   < > 17   < > 22* 26* 41* 29* 40*  CREATININE 4.73*   4.71*   < > 4.09*   < > 5.69* 7.79* 10.52* 8.32* 10.94*  CALCIUM 7.7*   7.5*   < > 7.8*   < > 7.9* 7.9* 8.0* 7.7* 7.9*  MG 2.3  --   --   --  2.4 2.4 2.6* 2.3  --   PHOS 3.5   3.3  --  2.6  --  4.8*  --  8.0* 7.8*  --    < > = values in this interval not displayed.   Liver Function Tests: Recent Labs  Lab 08/03/20 0429 08/04/20 0303 08/05/20 0418 08/06/20 0331 08/07/20 0442  AST 157* 99* 61* 46* 38  ALT 798* 579* 438* 308* 227*  ALKPHOS 184* 187* 177* 164* 145*  BILITOT 9.3* 7.9* 7.0* 5.1* 4.7*  PROT 5.0* 5.1* 5.3* 5.4* 5.4*  ALBUMIN 2.4*   2.3* 2.1* 2.3* 2.2* 2.2*   No results for input(s): LIPASE, AMYLASE in the last  168 hours. Recent Labs  Lab 08/03/20 0429 08/04/20 0303 08/05/20 0418 08/06/20 0331 08/07/20 0442  AMMONIA 40* 46* 33 30 31   Cardiac Enzymes: No results for input(s): CKTOTAL, CKMB, CKMBINDEX, TROPONINI in the last 168 hours. BNP (last 3 results) No results for input(s): BNP in the last 8760 hours.  ProBNP (last 3 results) No results for input(s): PROBNP in the last 8760 hours.  CBG: No results  for input(s): GLUCAP in the last 168 hours. Recent Results (from the past 240 hour(s))  SARS Coronavirus 2 by RT PCR (hospital order, performed in [facility] hospital lab) Nasopharyngeal Nasopharyngeal Swab     Status: None   Collection Time: 07/28/20  5:46 PM   Specimen: Nasopharyngeal Swab  Result Value Ref Range Status   SARS Coronavirus 2 NEGATIVE NEGATIVE Final    Comment: (NOTE) SARS-CoV-2 target nucleic acids are NOT DETECTED.  The SARS-CoV-2 RNA is generally detectable in upper and lower respiratory specimens during the acute phase of infection. The lowest concentration of SARS-CoV-2 viral copies this assay can detect is 250 copies / mL. A negative result does not preclude SARS-CoV-2 infection and should not be used as the sole basis for treatment or other patient management decisions.  A negative result may occur with improper specimen collection / handling, submission of specimen other than nasopharyngeal swab, presence of viral mutation(s) within the areas targeted by this assay, and inadequate number of viral copies (<250 copies / mL). A negative result must be combined with clinical observations, patient history, and epidemiological information.  Fact Sheet for Patients:   BoilerBrush.com.cy  Fact Sheet for Healthcare Providers: https://pope.com/  This test is not yet approved or  cleared by the [place] FDA and has been authorized for detection and/or diagnosis of SARS-CoV-2 by FDA under an Emergency Use  Authorization (EUA).  This EUA will remain in effect (meaning this test can be used) for the duration of the COVID-19 declaration under Section 564(b)(1) of the Act, 21 U.S.C. section 360bbb-3(b)(1), unless the authorization is terminated or revoked sooner.  Performed at [facility], [address] [place], [place] [address]   MRSA PCR Screening     Status: Abnormal   Collection Time: 07/29/20 12:30 PM   Specimen: Nasal Mucosa; Nasopharyngeal  Result Value Ref Range Status   MRSA by PCR POSITIVE (A) NEGATIVE Final    Comment:        The GeneXpert MRSA Assay (FDA approved for NASAL specimens only), is one component of a comprehensive MRSA colonization surveillance program. It is not intended to diagnose MRSA infection nor to guide or monitor treatment for MRSA infections. RESULT CALLED TO, READ BACK BY AND VERIFIED WITH: SEGERS,H. RN @1510  ON 08.10.2021 BY COHEN,K Performed at [facility], [address], [place], [place]      Studies: No results found.  [id number], MD  [facility] 08/07/2020

## 2020-08-08 ENCOUNTER — Inpatient Hospital Stay (HOSPITAL_COMMUNITY): Payer: Medicaid Other

## 2020-08-08 LAB — CBC
HCT: 26.9 % — ABNORMAL LOW (ref 39.0–52.0)
Hemoglobin: 9.1 g/dL — ABNORMAL LOW (ref 13.0–17.0)
MCH: 32.5 pg (ref 26.0–34.0)
MCHC: 33.8 g/dL (ref 30.0–36.0)
MCV: 96.1 fL (ref 80.0–100.0)
Platelets: 249 10*3/uL (ref 150–400)
RBC: 2.8 MIL/uL — ABNORMAL LOW (ref 4.22–5.81)
RDW: 14.5 % (ref 11.5–15.5)
WBC: 9.2 10*3/uL (ref 4.0–10.5)
nRBC: 0 % (ref 0.0–0.2)

## 2020-08-08 LAB — RENAL FUNCTION PANEL
Albumin: 2.3 g/dL — ABNORMAL LOW (ref 3.5–5.0)
Anion gap: 10 (ref 5–15)
BUN: 20 mg/dL (ref 6–20)
CO2: 26 mmol/L (ref 22–32)
Calcium: 8.1 mg/dL — ABNORMAL LOW (ref 8.9–10.3)
Chloride: 95 mmol/L — ABNORMAL LOW (ref 98–111)
Creatinine, Ser: 7.01 mg/dL — ABNORMAL HIGH (ref 0.61–1.24)
GFR calc Af Amer: 10 mL/min — ABNORMAL LOW (ref >60)
GFR calc non Af Amer: 9 mL/min — ABNORMAL LOW (ref >60)
Glucose, Bld: 115 mg/dL — ABNORMAL HIGH (ref 70–99)
Phosphorus: 5.5 mg/dL — ABNORMAL HIGH (ref 2.5–4.6)
Potassium: 4.4 mmol/L (ref 3.5–5.1)
Sodium: 131 mmol/L — ABNORMAL LOW (ref 135–145)

## 2020-08-08 LAB — PROTIME-INR
INR: 1.1 (ref 0.8–1.2)
Prothrombin Time: 13.5 seconds (ref 11.4–15.2)

## 2020-08-08 LAB — HEPATIC FUNCTION PANEL
ALT: 184 U/L — ABNORMAL HIGH (ref 0–44)
AST: 37 U/L (ref 15–41)
Albumin: 2.3 g/dL — ABNORMAL LOW (ref 3.5–5.0)
Alkaline Phosphatase: 185 U/L — ABNORMAL HIGH (ref 38–126)
Bilirubin, Direct: 2 mg/dL — ABNORMAL HIGH (ref 0.0–0.2)
Indirect Bilirubin: 1.9 mg/dL — ABNORMAL HIGH (ref 0.3–0.9)
Total Bilirubin: 3.9 mg/dL — ABNORMAL HIGH (ref 0.3–1.2)
Total Protein: 6.1 g/dL — ABNORMAL LOW (ref 6.5–8.1)

## 2020-08-08 LAB — AMMONIA: Ammonia: 28 umol/L (ref 9–35)

## 2020-08-08 LAB — APTT: aPTT: 36 seconds (ref 24–36)

## 2020-08-08 MED ORDER — POLYETHYLENE GLYCOL 3350 17 G PO PACK: 17.0000 g | PACK | Freq: Every day | ORAL | Status: DC

## 2020-08-08 MED ORDER — SENNOSIDES-DOCUSATE SODIUM 8.6-50 MG PO TABS
1.0000 | ORAL_TABLET | Freq: Two times a day (BID) | ORAL | Status: DC
  Administered 2020-08-08 – 2020-08-14 (×12): 1 via ORAL
  Filled 2020-08-08 (×12): qty 1

## 2020-08-08 MED ORDER — POLYETHYLENE GLYCOL 3350 17 G PO PACK
17.0000 g | PACK | Freq: Two times a day (BID) | ORAL | Status: DC
  Administered 2020-08-08 – 2020-08-14 (×11): 17 g via ORAL
  Filled 2020-08-08 (×12): qty 1

## 2020-08-08 MED ORDER — CLARITHROMYCIN 250 MG PO TABS
250.0000 mg | ORAL_TABLET | Freq: Two times a day (BID) | ORAL | Status: DC
  Administered 2020-08-08 – 2020-08-14 (×13): 250 mg via ORAL
  Filled 2020-08-08 (×14): qty 1

## 2020-08-08 MED ORDER — AMOXICILLIN 500 MG PO CAPS
500.0000 mg | ORAL_CAPSULE | Freq: Two times a day (BID) | ORAL | Status: DC
  Administered 2020-08-08 – 2020-08-14 (×13): 500 mg via ORAL
  Filled 2020-08-08 (×14): qty 1

## 2020-08-08 NOTE — Progress Notes (Signed)
PROGRESS NOTE  Paul Reese VOJ:[id number] DOB: 1981/04/09 DOA: 07/28/2020 PCP: Pennie Banter, MD   LOS: 11 days   Brief narrative: 38/M with history of alcohol and polysubstance abuse presented to the ED with unintentional Tylenol overdose.  In addition also is drinking 6 to 12 pack of beer every day -Presented to the ED on 8/8 with abdominal pain, cramps and discomfort -The emergency room was noted to have abnormal LFTs with AST and ALT greater than 10,000, in addition, bilirubin was 5.6 creatinine was 2.2 ammonia level is 40, platelet count was 90 5K and INR was 2.5 -Resident admitted with acute liver injury from Tylenol toxicity and ATN likely from hyper bilirubinemia -Treated with N acetylcysteine per poison control protocol in the ICU, [facility] transplant team was consulted on 8/9, not felt to be appropriate/candidate for transplant -Had progressive worsening of kidney function, treated with CRRT in the ICU then transition to intermittent HD, urine output slowly improving  Assessment/Plan:  Acute liver injury  Unintentional Tylenol overdose  Alcoholic liver disease  -Acute hepatitis panel was negative on admission -Declined by [facility] transplant team -Treated with NAC -LFTs improving, continue to trend -Alcohol cessation counseled  Abd pain -LFTS, bili improving -Suspect this could be related to ascites as well as constipation, improving with laxatives and dialysis yesterday -Continue supportive care, hold off on further imaging at this time  Acute kidney injury/ATN -Suspected to be secondary to acute liver failure, Tylenol toxicity, creatinine progressively worsened from 2.2 on admission up to 9/10, baseline creatinine is 1.0 from 07/09/2020 in care everywhere -Started on CRRT in the ICU, now on intermittent hemodialysis -Last HD was 8/19, urine output of >1L in past 24 hours -Hopeful for renal recovery,  -Plan to hold off on tunneled HD cath for now  Nausea  vomiting Gastritis/esophageal ulcer -Noted on endoscopy 8/17, felt to be consistent with healing Mallory-Weiss tear -Continue PPI -Follow-up with gastroenterology as outpatient for alcoholic liver disease and above  Acute hypoxic failure has improved.  Currently on room air.  Metabolic encephalopathy improving.  Hyponatremia.   -Improving  Normocytic anemia:  -Stable, monitor   Substance use disorder: history of ETOH and cocaine use. Presented with elevated ETOH but negative UDS.  DVT prophylaxis: SCDs Start: 07/29/20 1218  Code Status: Full code   Family Communication: None  Status is: Inpatient  Remains inpatient appropriate because:IV treatments appropriate due to intensity of illness or inability to take PO and Inpatient level of care appropriate due to severity of illness, severe renal failure  Dispo: The patient is from: Home              Anticipated d/c is to: Home              Anticipated d/c date is: To be determined, pending renal recovery              Patient currently is not medically stable to d/c.   Consultants:  GI  PCCM  Nephrology  Procedures: 8/12 Started CRRT 8/14 changed to CRRT 12h / day  Right internal jugular hemodialysis catheter placement Foley catheter placement Upper GI endoscopy on 08/05/2020 Hemodialysis planned for 08/05/2020  Antibiotics:  . None  Anti-infectives (From admission, onward)   Start     Dose/Rate Route Frequency Ordered Stop   08/07/20 0600  ceFAZolin (ANCEF) IVPB 2g/100 mL premix        2 g 200 mL/hr over 30 Minutes Intravenous To Radiology 08/07/20 0352 08/08/20 0600  Subjective: -Abdominal pain is better today, no nausea or vomiting, dialyzed yesterday, finally had a bowel movement  Objective: Vitals:   08/08/20 0520 08/08/20 1000  BP: (!) 150/107 (!) 153/97  Pulse: 81 84  Resp: 18 18  Temp: 99.1 F (37.3 C) 98.4 F (36.9 C)  SpO2: 95% 93%    Intake/Output Summary (Last 24 hours) at 08/08/2020  1142 Last data filed at 08/08/2020 1000 Gross per 24 hour  Intake 660 ml  Output 2500 ml  Net -1840 ml   Filed Weights   08/06/20 2107 08/07/20 1320 08/07/20 1705  Weight: 75.6 kg 77.5 kg 75.7 kg   Body mass index is 22.02 kg/m.   Physical Exam: General: Pleasant young male sitting up in bed, AAOx3, no distress HEENT: Right IJ HD catheter noted, positive icterus/improving CVS: S1-S2, regular rate rhythm Lungs: Few basilar rales Abdomen: Soft, less tender, mild periumbilical discomfort Bowel sounds present Extremities: No edema  Data Review: I have personally reviewed the following laboratory data and studies,  CBC: Recent Labs  Lab 08/02/20 0423 08/05/20 0418 08/06/20 0331 08/07/20 0442 08/08/20 0350  WBC 6.1 8.0 7.8 8.8 9.2  HGB 11.3* 9.5* 9.2* 8.5* 9.1*  HCT 32.3* 27.3* 26.6* 24.6* 26.9*  MCV 92.0 91.9 94.3 94.6 96.1  PLT 103* 132* 177 182 249   Basic Metabolic Panel: Recent Labs  Lab 08/02/20 0423 08/02/20 0423 08/02/20 1600 08/02/20 1600 08/03/20 0429 08/03/20 0429 08/04/20 0303 08/05/20 0418 08/06/20 0331 08/07/20 0442 08/08/20 0350  NA 133*  127*   < > 132*   < > 128*   < > 127* 124* 128* 126* 131*  K 4.3  4.0   < > 4.5   < > 4.8   < > 4.9 4.7 4.3 4.9 4.4  CL 99  96*   < > 100   < > 97*   < > 97* 92* 94* 92* 95*  CO2 25  23   < > 26   < > 23   < > 21* 21* 26 23 26   GLUCOSE 128*  125*   < > 118*   < > 93   < > 90 91 97 98 115*  BUN 20  20   < > 17   < > 22*   < > 26* 41* 29* 40* 20  CREATININE 4.73*  4.71*   < > 4.09*   < > 5.69*   < > 7.79* 10.52* 8.32* 10.94* 7.01*  CALCIUM 7.7*  7.5*   < > 7.8*   < > 7.9*   < > 7.9* 8.0* 7.7* 7.9* 8.1*  MG 2.3  --   --   --  2.4  --  2.4 2.6* 2.3  --   --   PHOS 3.5  3.3   < > 2.6  --  4.8*  --   --  8.0* 7.8*  --  5.5*   < > = values in this interval not displayed.   Liver Function Tests: Recent Labs  Lab 08/04/20 0303 08/05/20 0418 08/06/20 0331 08/07/20 0442 08/08/20 0350  AST 99* 61* 46* 38  37  ALT 579* 438* 308* 227* 184*  ALKPHOS 187* 177* 164* 145* 185*  BILITOT 7.9* 7.0* 5.1* 4.7* 3.9*  PROT 5.1* 5.3* 5.4* 5.4* 6.1*  ALBUMIN 2.1* 2.3* 2.2* 2.2* 2.3*  2.3*   No results for input(s): LIPASE, AMYLASE in the last 168 hours. Recent Labs  Lab 08/04/20 0303 08/05/20 0418 08/06/20 0331 08/07/20 0442 08/08/20  0350  AMMONIA 46* 33 30 31 28    Cardiac Enzymes: No results for input(s): CKTOTAL, CKMB, CKMBINDEX, TROPONINI in the last 168 hours. BNP (last 3 results) No results for input(s): BNP in the last 8760 hours.  ProBNP (last 3 results) No results for input(s): PROBNP in the last 8760 hours.  CBG: No results for input(s): GLUCAP in the last 168 hours. Recent Results (from the past 240 hour(s))  MRSA PCR Screening     Status: Abnormal   Collection Time: 07/29/20 12:30 PM   Specimen: Nasal Mucosa; Nasopharyngeal  Result Value Ref Range Status   MRSA by PCR POSITIVE (A) NEGATIVE Final    Comment:        The GeneXpert MRSA Assay (FDA approved for NASAL specimens only), is one component of a comprehensive MRSA colonization surveillance program. It is not intended to diagnose MRSA infection nor to guide or monitor treatment for MRSA infections. RESULT CALLED TO, READ BACK BY AND VERIFIED WITH: SEGERS,H. RN @1510  ON 08.10.2021 BY COHEN,K Performed at [facility], [address] [place], [place]      Studies: No results found.  [place], MD  [facility] 08/08/2020

## 2020-08-08 NOTE — Progress Notes (Signed)
[facility] Progress Note  Admit: 07/28/2020 LOS: 11  Subjective:  Patient states that his abdominal pain has improved. After receiving magnesium citrate yesterday he states that "I pooped a brick". Urine output continues to improve. He is very hungry today.   08/19 0701 - 08/20 0700 In: 540 [P.O.:540] Out: 3650 [Urine:1650]  Filed Weights   08/06/20 2107 08/07/20 1320 08/07/20 1705  Weight: 75.6 kg 77.5 kg 75.7 kg    Scheduled Meds: . amLODipine  5 mg Oral Daily  . Chlorhexidine Gluconate Cloth  6 each Topical Q0600  . feeding supplement  1 Container Oral BID BM  . pantoprazole  40 mg Oral BID  . polyethylene glycol  17 g Oral BID  . senna-docusate  1 tablet Oral BID  . sodium chloride flush  10-40 mL Intracatheter Q12H   Continuous Infusions: . ferric gluconate (FERRLECIT/NULECIT) IV 250 mg (08/08/20 0930)   PRN Meds:.calcium carbonate, diphenhydrAMINE, hydrocortisone, HYDROmorphone (DILAUDID) injection, labetalol, lip balm, ondansetron (ZOFRAN) IV, sodium chloride flush   Physical Exam:  Blood pressure (!) 153/97, pulse 84, temperature 98.4 F (36.9 C), temperature source Oral, resp. rate 18, height 6\' 1"  (1.854 m), weight 75.7 kg, SpO2 93 %. GEN: wdwn, sitting in bed, mild distress ENT: no nasal discharge, mmm EYES: Scleral icterus present, no conjunctival injection CV: normal rate, no murmurs PULM: no iwob, bilateral chest rise ABD: NABS, mild distention, tympanitic  SKIN: no rashes, minimal jaundice EXT: no edema, warm and well perfused  Assessment/Plan:   1.   Severe dialysis dependent AKI - acute kidney injury secondary to Tylenol toxicity causing ATN.  Presumed normal baseline kidney function. Patient is slow to recover his kidney function. Unfortunately not an outpatient dialysis candidate at this time given he does not have insurance and is currently AKI. He continues to have good urine output. We will hold off on tunneled dialysis catheter and  hold dialysis and hopefully monitor for recovery -Hold dialysis at this time -Monitor labs daily, monitor urine output -Hold off on [facility] for now -Monitor closely for recovery -Continue phosphorus restriction - Consider Iron supplementation and EPO for anemia   2. Acute Liver Failure -GI and primary team managing. Seems to be improving.  3. Normocytic anemia - likely multifactorial with some GI blood loss related to inflamed gastric mucosa on the EGD.  Iron saturation 23 with ferritin of 195. Continue iron supplementation  4. Hypertension: Continues to be slightly hypertensive. Continue Norvasc 5 mg daily. Hopefully will improve as his urine output improves  5. Abdominal Pain/constipation: Likely secondary to constipation. Other pathology possible and consider CT scan if he does not improve. Increase MiraLAX to twice daily.  6. Hyponatremia: Related to free water retention in the setting of kidney disease. Sodium 131 today. Continue to monitor.  7. H pylori: Present on surgical biopsy specimens from EGD. Will discuss with primary that he may require treatment.   Recent Labs  Lab 08/05/20 0418 08/05/20 0418 08/06/20 0331 08/07/20 0442 08/08/20 0350  NA 124*   < > 128* 126* 131*  K 4.7   < > 4.3 4.9 4.4  CL 92*   < > 94* 92* 95*  CO2 21*   < > 26 23 26   GLUCOSE 91   < > 97 98 115*  BUN 41*   < > 29* 40* 20  CREATININE 10.52*   < > 8.32* 10.94* 7.01*  CALCIUM 8.0*   < > 7.7* 7.9* 8.1*  PHOS 8.0*  --  7.8*  --  5.5*   < > = values in this interval not displayed.   Recent Labs  Lab 08/06/20 0331 08/07/20 0442 08/08/20 0350  WBC 7.8 8.8 9.2  HGB 9.2* 8.5* 9.1*  HCT 26.6* 24.6* 26.9*  MCV 94.3 94.6 96.1  PLT 177 182 249

## 2020-08-09 ENCOUNTER — Inpatient Hospital Stay (HOSPITAL_COMMUNITY): Payer: Medicaid Other

## 2020-08-09 LAB — CBC
HCT: 25.5 % — ABNORMAL LOW (ref 39.0–52.0)
Hemoglobin: 8.5 g/dL — ABNORMAL LOW (ref 13.0–17.0)
MCH: 32.2 pg (ref 26.0–34.0)
MCHC: 33.3 g/dL (ref 30.0–36.0)
MCV: 96.6 fL (ref 80.0–100.0)
Platelets: 262 10*3/uL (ref 150–400)
RBC: 2.64 MIL/uL — ABNORMAL LOW (ref 4.22–5.81)
RDW: 14.3 % (ref 11.5–15.5)
WBC: 9.8 10*3/uL (ref 4.0–10.5)
nRBC: 0 % (ref 0.0–0.2)

## 2020-08-09 LAB — RENAL FUNCTION PANEL
Albumin: 2.2 g/dL — ABNORMAL LOW (ref 3.5–5.0)
Anion gap: 12 (ref 5–15)
BUN: 27 mg/dL — ABNORMAL HIGH (ref 6–20)
CO2: 24 mmol/L (ref 22–32)
Calcium: 7.9 mg/dL — ABNORMAL LOW (ref 8.9–10.3)
Chloride: 92 mmol/L — ABNORMAL LOW (ref 98–111)
Creatinine, Ser: 9.11 mg/dL — ABNORMAL HIGH (ref 0.61–1.24)
GFR calc Af Amer: 8 mL/min — ABNORMAL LOW (ref >60)
GFR calc non Af Amer: 6 mL/min — ABNORMAL LOW (ref >60)
Glucose, Bld: 132 mg/dL — ABNORMAL HIGH (ref 70–99)
Phosphorus: 5.2 mg/dL — ABNORMAL HIGH (ref 2.5–4.6)
Potassium: 4.4 mmol/L (ref 3.5–5.1)
Sodium: 128 mmol/L — ABNORMAL LOW (ref 135–145)

## 2020-08-09 LAB — HEPATIC FUNCTION PANEL
ALT: 137 U/L — ABNORMAL HIGH (ref 0–44)
AST: 28 U/L (ref 15–41)
Albumin: 2.2 g/dL — ABNORMAL LOW (ref 3.5–5.0)
Alkaline Phosphatase: 151 U/L — ABNORMAL HIGH (ref 38–126)
Bilirubin, Direct: 1.5 mg/dL — ABNORMAL HIGH (ref 0.0–0.2)
Indirect Bilirubin: 1.7 mg/dL — ABNORMAL HIGH (ref 0.3–0.9)
Total Bilirubin: 3.2 mg/dL — ABNORMAL HIGH (ref 0.3–1.2)
Total Protein: 5.6 g/dL — ABNORMAL LOW (ref 6.5–8.1)

## 2020-08-09 LAB — BRAIN NATRIURETIC PEPTIDE: B Natriuretic Peptide: 2462.3 pg/mL — ABNORMAL HIGH (ref 0.0–100.0)

## 2020-08-09 MED ORDER — FUROSEMIDE 10 MG/ML IJ SOLN
40.0000 mg | Freq: Once | INTRAMUSCULAR | Status: AC
  Administered 2020-08-09: 40 mg via INTRAVENOUS
  Filled 2020-08-09: qty 4

## 2020-08-09 MED ORDER — ENOXAPARIN SODIUM 30 MG/0.3ML ~~LOC~~ SOLN
30.0000 mg | SUBCUTANEOUS | Status: DC
  Administered 2020-08-09 – 2020-08-14 (×6): 30 mg via SUBCUTANEOUS
  Filled 2020-08-09 (×6): qty 0.3

## 2020-08-09 MED ORDER — FUROSEMIDE 10 MG/ML IJ SOLN
80.0000 mg | Freq: Once | INTRAMUSCULAR | Status: AC
  Administered 2020-08-09: 80 mg via INTRAVENOUS
  Filled 2020-08-09: qty 8

## 2020-08-09 MED ORDER — IOHEXOL 9 MG/ML PO SOLN
500.0000 mL | ORAL | Status: AC
  Administered 2020-08-09 (×2): 500 mL via ORAL

## 2020-08-09 NOTE — Progress Notes (Signed)
[facility] Progress Note  Admit: 07/28/2020 LOS: 12  Subjective:  Patient continues to have pain and states that he is fluid on his back.  He continues to urinate significantly.  He was given some Lasix this morning after he had to be put on nasal cannula but otherwise is feeling okay.   08/20 0701 - 08/21 0700 In: 960 [P.O.:960] Out: 800 [Urine:800]  Filed Weights   08/06/20 2107 08/07/20 1320 08/07/20 1705  Weight: 75.6 kg 77.5 kg 75.7 kg    Scheduled Meds: . amLODipine  5 mg Oral Daily  . amoxicillin  500 mg Oral Q12H  . Chlorhexidine Gluconate Cloth  6 each Topical Q0600  . clarithromycin  250 mg Oral Q12H  . enoxaparin (LOVENOX) injection  30 mg Subcutaneous Q24H  . feeding supplement  1 Container Oral BID BM  . furosemide  80 mg Intravenous Once  . iohexol  500 mL Oral Q1H  . pantoprazole  40 mg Oral BID  . polyethylene glycol  17 g Oral BID  . senna-docusate  1 tablet Oral BID  . sodium chloride flush  10-40 mL Intracatheter Q12H   Continuous Infusions: . ferric gluconate (FERRLECIT/NULECIT) IV 250 mg (08/08/20 0930)   PRN Meds:.calcium carbonate, diphenhydrAMINE, hydrocortisone, HYDROmorphone (DILAUDID) injection, labetalol, lip balm, ondansetron (ZOFRAN) IV, sodium chloride flush   Physical Exam:  Blood pressure (!) 146/104, pulse 81, temperature 99.7 F (37.6 C), temperature source Oral, resp. rate 18, height 6\' 1"  (1.854 m), weight 75.7 kg, SpO2 96 %. GEN: wdwn, sitting in bed, mild distress ENT: no nasal discharge, mmm EYES: Scleral icterus present, no conjunctival injection CV: normal rate, no murmurs, sacral edema present PULM: no iwob, bilateral chest rise ABD: NABS, mild distention, tympanitic  SKIN: no rashes, minimal jaundice  Assessment/Plan:   1.   Severe dialysis dependent AKI - acute kidney injury secondary to Tylenol toxicity causing ATN.  Presumed normal baseline kidney function. Patient is slow to recover his kidney function.  Unfortunately not an outpatient dialysis candidate at this time given he does not have insurance and is currently AKI.  Holding on [facility] and hopefully monitoring for renal recovery -Hold dialysis at this time -Start Lasix 80 mg twice daily due to volume overload -Monitor labs daily, monitor urine output -Hold off on [facility] for now -Monitor closely for recovery -Continue phosphorus restriction - Consider Iron supplementation and EPO for anemia   2. Acute Liver Failure -GI and primary team managing. Seems to be improving.  3. Normocytic anemia - likely multifactorial with some GI blood loss related to inflamed gastric mucosa on the EGD.  Iron saturation 23 with ferritin of 195. Continue iron supplementation  4. Hypertension: Continues to be slightly hypertensive. Continue Norvasc 5 mg daily. Hopefully will improve as his urine output improves  5. Abdominal Pain/constipation: Likely secondary to constipation but has not fully improved.  CT abdomen pelvis today  6. Hyponatremia: Related to free water retention in the setting of kidney disease. Sodium 128 today.  Starting diuretics as above  7. H pylori: Present on surgical biopsy specimens from EGD.  Treatment per primary team   Recent Labs  Lab 08/06/20 0331 08/06/20 0331 08/07/20 0442 08/08/20 0350 08/09/20 0346  NA 128*   < > 126* 131* 128*  K 4.3   < > 4.9 4.4 4.4  CL 94*   < > 92* 95* 92*  CO2 26   < > 23 26 24   GLUCOSE 97   < > 98 115*  132*  BUN 29*   < > 40* 20 27*  CREATININE 8.32*   < > 10.94* 7.01* 9.11*  CALCIUM 7.7*   < > 7.9* 8.1* 7.9*  PHOS 7.8*  --   --  5.5* 5.2*   < > = values in this interval not displayed.   Recent Labs  Lab 08/07/20 0442 08/08/20 0350 08/09/20 0346  WBC 8.8 9.2 9.8  HGB 8.5* 9.1* 8.5*  HCT 24.6* 26.9* 25.5*  MCV 94.6 96.1 96.6  PLT 182 249 262

## 2020-08-09 NOTE — Progress Notes (Addendum)
PROGRESS NOTE  Paul Reese SWF:[id number] DOB: [birth year] DOA: 07/28/2020 PCP: Pennie Banter, MD   LOS: 12 days   Brief narrative: 38/M with history of alcohol and polysubstance abuse presented to the ED with unintentional Tylenol overdose.  In addition also is drinking 6 to 12 pack of beer every day -Presented to the ED on 8/8 with abdominal pain, cramps and discomfort -The emergency room was noted to have abnormal LFTs with AST and ALT greater than 10,000, in addition, bilirubin was 5.6 creatinine was 2.2 ammonia level is 40, platelet count was 90 5K and INR was 2.5 -Resident admitted with acute liver injury from Tylenol toxicity and ATN likely from hyper bilirubinemia -Treated with N acetylcysteine per poison control protocol in the ICU, [facility] transplant team was consulted on 8/9, not felt to be appropriate/candidate for transplant -Had progressive worsening of kidney function, treated with CRRT in the ICU then transition to intermittent HD, urine output slowly improving  Assessment/Plan:  Acute liver injury  Unintentional Tylenol overdose  Alcoholic liver disease  -Acute hepatitis panel was negative on admission -Declined by [facility] transplant team -Treated with NAC -LFTs improving, bili down to 3.2 today -Alcohol cessation counseled  Acute kidney injury/ATN -Suspected to be secondary to acute liver failure, Tylenol toxicity, creatinine progressively worsened from 2.2 on admission up to 9/10, baseline creatinine is 1.0 from 07/09/2020 in care everywhere -Started on CRRT in the ICU, now on intermittent hemodialysis -Last HD was 8/19, urine output 800 mL in past 24 hours -Hopeful for renal recovery, clinically slightly fluid overloaded -Plan to hold off on tunneled HD cath for now  Abd pain -LFTS, bili improving -Likely from fluid overload/ascites constipation, gastritis/esophageal ulcer  -Having bowel movements now, continue PPI , started H pylori therapy  -Not unreasonable to  reimage, will obtain CT abdomen pelvis without IV contrast -suspect this could be related to ascites as well as constipation, improving with laxatives and dialysis  Nausea vomiting Gastritis/esophageal ulcer, positive H. pylori -Noted on endoscopy 8/17, felt to be consistent with healing Mallory-Weiss tear -Continue PPI -Started clarithromycin and amoxicillin for H. pylori on 8/20 -Follow-up with gastroenterology as outpatient for alcoholic liver disease and above  Acute hypoxic failure has improved.  Currently on room air.  Metabolic encephalopathy improving.  Hyponatremia.   -Improving  Normocytic anemia:  -Stable, monitor   Substance use disorder: history of ETOH and cocaine use. Presented with elevated ETOH but negative UDS.  DVT prophylaxis: Add Lovenox  Code Status: Full code   Family Communication: None  Status is: Inpatient  Remains inpatient appropriate because:IV treatments appropriate due to intensity of illness or inability to take PO and Inpatient level of care appropriate due to severity of illness, severe renal failure  Dispo: The patient is from: Home              Anticipated d/c is to: Home              Anticipated d/c date is: To be determined, pending renal recovery              Patient currently is not medically stable to d/c.   Consultants:  GI  PCCM  Nephrology  Procedures: 8/12 Started CRRT 8/14 changed to CRRT 12h / day  Right internal jugular hemodialysis catheter placement Foley catheter placement Upper GI endoscopy on 08/05/2020 Hemodialysis planned for 08/05/2020  Antibiotics:  . None  Anti-infectives (From admission, onward)   Start     Dose/Rate Route Frequency Ordered Stop  08/08/20 1500  amoxicillin (AMOXIL) capsule 500 mg        500 mg Oral Every 12 hours 08/08/20 1431     08/08/20 1500  clarithromycin (BIAXIN) tablet 250 mg        250 mg Oral Every 12 hours 08/08/20 1431     08/07/20 0600  ceFAZolin (ANCEF) IVPB 2g/100  mL premix        2 g 200 mL/hr over 30 Minutes Intravenous To Radiology 08/07/20 0352 08/08/20 0600      Subjective: -Had some shortness of breath last night, some lower back discomfort  Objective: Vitals:   08/09/20 0522 08/09/20 0911  BP:  (!) 146/104  Pulse: 81 81  Resp:  18  Temp:  99.7 F (37.6 C)  SpO2: 92% 96%    Intake/Output Summary (Last 24 hours) at 08/09/2020 1124 Last data filed at 08/09/2020 0900 Gross per 24 hour  Intake 1200 ml  Output 800 ml  Net 400 ml   Filed Weights   08/06/20 2107 08/07/20 1320 08/07/20 1705  Weight: 75.6 kg 77.5 kg 75.7 kg   Body mass index is 22.02 kg/m.   Physical Exam: Pleasant young male sitting up in bed, AAOx3, uncomfortable appearing HEENT: Right IJ HD catheter, improving icterus CVS: S1-S2, regular rate rhythm Lungs: Few basilar rales Abdomen: Soft, mildly distended, less tender, mild periumbilical tenderness bowel sounds present Extremities: no edema   Data Review: I have personally reviewed the following laboratory data and studies,  CBC: Recent Labs  Lab 08/05/20 0418 08/06/20 0331 08/07/20 0442 08/08/20 0350 08/09/20 0346  WBC 8.0 7.8 8.8 9.2 9.8  HGB 9.5* 9.2* 8.5* 9.1* 8.5*  HCT 27.3* 26.6* 24.6* 26.9* 25.5*  MCV 91.9 94.3 94.6 96.1 96.6  PLT 132* 177 182 249 262   Basic Metabolic Panel: Recent Labs  Lab 08/03/20 0429 08/03/20 0429 08/04/20 0303 08/04/20 0303 08/05/20 0418 08/06/20 0331 08/07/20 0442 08/08/20 0350 08/09/20 0346  NA 128*   < > 127*   < > 124* 128* 126* 131* 128*  K 4.8   < > 4.9   < > 4.7 4.3 4.9 4.4 4.4  CL 97*   < > 97*   < > 92* 94* 92* 95* 92*  CO2 23   < > 21*   < > 21* 26 23 26 24   GLUCOSE 93   < > 90   < > 91 97 98 115* 132*  BUN 22*   < > 26*   < > 41* 29* 40* 20 27*  CREATININE 5.69*   < > 7.79*   < > 10.52* 8.32* 10.94* 7.01* 9.11*  CALCIUM 7.9*   < > 7.9*   < > 8.0* 7.7* 7.9* 8.1* 7.9*  MG 2.4  --  2.4  --  2.6* 2.3  --   --   --   PHOS 4.8*  --   --   --  8.0*  7.8*  --  5.5* 5.2*   < > = values in this interval not displayed.   Liver Function Tests: Recent Labs  Lab 08/05/20 0418 08/06/20 0331 08/07/20 0442 08/08/20 0350 08/09/20 0346  AST 61* 46* 38 37 28  ALT 438* 308* 227* 184* 137*  ALKPHOS 177* 164* 145* 185* 151*  BILITOT 7.0* 5.1* 4.7* 3.9* 3.2*  PROT 5.3* 5.4* 5.4* 6.1* 5.6*  ALBUMIN 2.3* 2.2* 2.2* 2.3*  2.3* 2.2*  2.2*   No results for input(s): LIPASE, AMYLASE in the last 168 hours. Recent  Labs  Lab 08/04/20 0303 08/05/20 0418 08/06/20 0331 08/07/20 0442 08/08/20 0350  AMMONIA 46* 33 30 31 28    Cardiac Enzymes: No results for input(s): CKTOTAL, CKMB, CKMBINDEX, TROPONINI in the last 168 hours. BNP (last 3 results) No results for input(s): BNP in the last 8760 hours.  ProBNP (last 3 results) No results for input(s): PROBNP in the last 8760 hours.  CBG: No results for input(s): GLUCAP in the last 168 hours. No results found for this or any previous visit (from the past 240 hour(s)).   Studies: DG Chest 2 View  Result Date: 08/08/2020 CLINICAL DATA:  39 year old male with shortness of breath. EXAM: CHEST - 2 VIEW COMPARISON:  Chest radiograph dated 08/02/2020. FINDINGS: Right IJ central venous line with tip over central SVC in similar position. There is mild cardiomegaly with vascular congestion and edema and small bilateral pleural effusions. Pneumonia is not excluded. Clinical correlation is recommended. Overall overall slight progression of the edema compared to the prior radiograph. No pneumothorax. No acute osseous pathology. IMPRESSION: Cardiomegaly with findings of CHF and small bilateral pleural effusions, slightly progressed since the prior radiograph. Electronically Signed   By: 08/04/2020 M.D.   On: 08/08/2020 22:41    08/10/2020, MD  [facility] 08/09/2020

## 2020-08-10 ENCOUNTER — Encounter: Payer: Self-pay | Admitting: Gastroenterology

## 2020-08-10 LAB — CBC
HCT: 24.9 % — ABNORMAL LOW (ref 39.0–52.0)
Hemoglobin: 8.4 g/dL — ABNORMAL LOW (ref 13.0–17.0)
MCH: 32.3 pg (ref 26.0–34.0)
MCHC: 33.7 g/dL (ref 30.0–36.0)
MCV: 95.8 fL (ref 80.0–100.0)
Platelets: 287 10*3/uL (ref 150–400)
RBC: 2.6 MIL/uL — ABNORMAL LOW (ref 4.22–5.81)
RDW: 13.6 % (ref 11.5–15.5)
WBC: 9.4 10*3/uL (ref 4.0–10.5)
nRBC: 0 % (ref 0.0–0.2)

## 2020-08-10 LAB — HEPATIC FUNCTION PANEL
ALT: 96 U/L — ABNORMAL HIGH (ref 0–44)
AST: 20 U/L (ref 15–41)
Albumin: 2.1 g/dL — ABNORMAL LOW (ref 3.5–5.0)
Alkaline Phosphatase: 140 U/L — ABNORMAL HIGH (ref 38–126)
Bilirubin, Direct: 1.3 mg/dL — ABNORMAL HIGH (ref 0.0–0.2)
Indirect Bilirubin: 1.3 mg/dL — ABNORMAL HIGH (ref 0.3–0.9)
Total Bilirubin: 2.6 mg/dL — ABNORMAL HIGH (ref 0.3–1.2)
Total Protein: 5.7 g/dL — ABNORMAL LOW (ref 6.5–8.1)

## 2020-08-10 LAB — RENAL FUNCTION PANEL
Albumin: 2.1 g/dL — ABNORMAL LOW (ref 3.5–5.0)
Anion gap: 11 (ref 5–15)
BUN: 31 mg/dL — ABNORMAL HIGH (ref 6–20)
CO2: 25 mmol/L (ref 22–32)
Calcium: 7.9 mg/dL — ABNORMAL LOW (ref 8.9–10.3)
Chloride: 89 mmol/L — ABNORMAL LOW (ref 98–111)
Creatinine, Ser: 10.23 mg/dL — ABNORMAL HIGH (ref 0.61–1.24)
GFR calc Af Amer: 7 mL/min — ABNORMAL LOW (ref >60)
GFR calc non Af Amer: 6 mL/min — ABNORMAL LOW (ref >60)
Glucose, Bld: 144 mg/dL — ABNORMAL HIGH (ref 70–99)
Phosphorus: 6 mg/dL — ABNORMAL HIGH (ref 2.5–4.6)
Potassium: 4 mmol/L (ref 3.5–5.1)
Sodium: 125 mmol/L — ABNORMAL LOW (ref 135–145)

## 2020-08-10 MED ORDER — FUROSEMIDE 10 MG/ML IJ SOLN
80.0000 mg | Freq: Two times a day (BID) | INTRAMUSCULAR | Status: DC
  Administered 2020-08-10 – 2020-08-12 (×5): 80 mg via INTRAVENOUS
  Filled 2020-08-10 (×4): qty 8

## 2020-08-10 NOTE — Progress Notes (Signed)
[facility] Progress Note  Admit: 07/28/2020 LOS: 13  Subjective:  Patient feeling much better today.  Excellent response to Lasix with 2.6 L of urine output.  Creatinine minimally elevated.   08/21 0701 - 08/22 0700 In: 720 [P.O.:720] Out: 2575 [Urine:2575]  Filed Weights   08/07/20 1320 08/07/20 1705 08/10/20 0500  Weight: 77.5 kg 75.7 kg 75.7 kg    Scheduled Meds: . amLODipine  5 mg Oral Daily  . amoxicillin  500 mg Oral Q12H  . Chlorhexidine Gluconate Cloth  6 each Topical Q0600  . clarithromycin  250 mg Oral Q12H  . enoxaparin (LOVENOX) injection  30 mg Subcutaneous Q24H  . feeding supplement  1 Container Oral BID BM  . furosemide  80 mg Intravenous BID  . pantoprazole  40 mg Oral BID  . polyethylene glycol  17 g Oral BID  . senna-docusate  1 tablet Oral BID  . sodium chloride flush  10-40 mL Intracatheter Q12H   Continuous Infusions: . ferric gluconate (FERRLECIT/NULECIT) IV 250 mg (08/10/20 1123)   PRN Meds:.calcium carbonate, diphenhydrAMINE, hydrocortisone, HYDROmorphone (DILAUDID) injection, labetalol, lip balm, ondansetron (ZOFRAN) IV, sodium chloride flush   Physical Exam:  Blood pressure (!) 132/95, pulse 79, temperature 98.5 F (36.9 C), temperature source Oral, resp. rate 20, height 6\' 1"  (1.854 m), weight 75.7 kg, SpO2 96 %. GEN: wdwn, sitting in bed, mild distress ENT: no nasal discharge, mmm EYES: No conjunctival injection CV: normal rate, no murmurs, sacral edema present PULM: no iwob, bilateral chest rise ABD: NABS, mild distention, tympanitic  SKIN: no rashes, minimal jaundice  Assessment/Plan:   1.   Severe dialysis dependent AKI - acute kidney injury secondary to Tylenol toxicity causing ATN.  Presumed normal baseline kidney function. Patient is slow to recover his kidney function. Unfortunately not an outpatient dialysis candidate at this time given he does not have insurance and is currently AKI.  Holding on [facility] and hopefully  monitoring for renal recovery -Hold dialysis at this time -IV Lasix 80 mg twice daily, persistent volume overload -Monitor labs daily, monitor urine output -Hold off on [facility] for now -Monitor closely for recovery -Continue phosphorus restriction - Consider Iron supplementation and EPO for anemia  -Consider dialysis tomorrow as needed  2. Acute Liver Failure -GI and primary team managing. Seems to be improving.  3. Normocytic anemia - likely multifactorial with some GI blood loss related to inflamed gastric mucosa on the EGD.  Iron saturation 23 with ferritin of 195. Continue iron supplementation  4. Hypertension: Continues to be slightly hypertensive. Continue Norvasc 5 mg daily. Hopefully will improve as his urine output improves  5. Abdominal Pain/constipation: Likely secondary to constipation but has not fully improved.  CT abdomen pelvis today  6. Hyponatremia: Sodium 125 today.  Related to free water retention in the setting of kidney disease.  Continue with diuretics and consider dialysis tomorrow if needed  7. H pylori: Present on surgical biopsy specimens from EGD.  Treatment per primary team   Recent Labs  Lab 08/08/20 0350 08/09/20 0346 08/10/20 0450  NA 131* 128* 125*  K 4.4 4.4 4.0  CL 95* 92* 89*  CO2 26 24 25   GLUCOSE 115* 132* 144*  BUN 20 27* 31*  CREATININE 7.01* 9.11* 10.23*  CALCIUM 8.1* 7.9* 7.9*  PHOS 5.5* 5.2* 6.0*   Recent Labs  Lab 08/08/20 0350 08/09/20 0346 08/10/20 0450  WBC 9.2 9.8 9.4  HGB 9.1* 8.5* 8.4*  HCT 26.9* 25.5* 24.9*  MCV 96.1 96.6  95.8  PLT 249 262 287

## 2020-08-10 NOTE — Progress Notes (Signed)
PROGRESS NOTE  Paul Reese QRF:[id number] DOB: [birth year] DOA: 07/28/2020 PCP: Pennie Banter, MD   LOS: 13 days   Brief narrative: 38/M with history of alcohol and polysubstance abuse presented to the ED with unintentional Tylenol overdose.  In addition also is drinking 6 to 12 pack of beer every day -Presented to the ED on 8/8 with abdominal pain, cramps and discomfort -The emergency room was noted to have abnormal LFTs with AST and ALT greater than 10,000, bilirubin was 5.6 creatinine was 2.2 ammonia level is 40, platelet count was 95K and INR was 2.5 -Resident admitted with acute liver injury from Tylenol toxicity and ATN likely from hyper bilirubinemia -Treated with N acetylcysteine per poison control protocol in the ICU, [facility] transplant team was consulted on 8/9, not felt to be appropriate/candidate for transplant -Had progressive worsening of kidney function, treated with CRRT in the ICU then transition to intermittent HD -Now urine output improving, hopeful for renal recovery  Assessment/Plan:  Acute liver injury  Unintentional Tylenol overdose  Alcoholic liver disease  -Acute hepatitis panel was negative on admission -Declined by [facility] transplant team -Treated with NAC -LFTs improving, bili down to 3.2 today -Alcohol cessation counseled  Acute kidney injury/ATN -Suspected to be secondary to acute liver failure, Tylenol toxicity, creatinine progressively worsened from 2.2 on admission up to 9/10, baseline creatinine is 1.0 from 07/09/2020 in care everywhere -Started on CRRT in the ICU, now on intermittent hemodialysis -Last HD was 8/19, urine output >2L in past 24 hours -Hopeful for renal recovery,  -Plan to hold off on tunneled HD cath for now -BMP in a.m.  Nausea vomiting, abdominal pain Gastritis/esophageal ulcer, positive H. pylori -Noted on endoscopy 8/17, felt to be consistent with healing Mallory-Weiss tear -Continue PPI -Started clarithromycin and amoxicillin  for H. pylori on 8/20 -Follow-up with gastroenterology as outpatient for alcoholic liver disease and above -Pain likely secondary to above and fluid overload/constipation now improving  Acute hypoxic failure has improved.  Currently on room air.  Metabolic encephalopathy improving.  Hyponatremia.   -Improving  Normocytic anemia:  -Stable, monitor   Substance use disorder: history of ETOH and cocaine use. Presented with elevated ETOH but negative UDS.  DVT prophylaxis: Lovenox  Code Status: Full code   Family Communication: None  Status is: Inpatient  Remains inpatient appropriate because:IV treatments appropriate due to intensity of illness or inability to take PO and Inpatient level of care appropriate due to severity of illness, severe renal failure  Dispo: The patient is from: Home              Anticipated d/c is to: Home              Anticipated d/c date is: To be determined, pending renal recovery              Patient currently is not medically stable to d/c.   Consultants:  GI  PCCM  Nephrology  Procedures: 8/12 Started CRRT 8/14 changed to CRRT 12h / day  Right internal jugular hemodialysis catheter placement Foley catheter placement Upper GI endoscopy on 08/05/2020 Hemodialysis planned for 08/05/2020  Antibiotics:  . None  Anti-infectives (From admission, onward)   Start     Dose/Rate Route Frequency Ordered Stop   08/08/20 1500  amoxicillin (AMOXIL) capsule 500 mg        500 mg Oral Every 12 hours 08/08/20 1431     08/08/20 1500  clarithromycin (BIAXIN) tablet 250 mg  250 mg Oral Every 12 hours 08/08/20 1431     08/07/20 0600  ceFAZolin (ANCEF) IVPB 2g/100 mL premix        2 g 200 mL/hr over 30 Minutes Intravenous To Radiology 08/07/20 0352 08/08/20 0600      Subjective: -Feels better overall, breathing improving, swelling starting to improve  Objective: Vitals:   08/10/20 0442 08/10/20 0920  BP: (!) 140/97 (!) 132/95  Pulse: 81 79    Resp: 18 20  Temp: 98.7 F (37.1 C) 98.5 F (36.9 C)  SpO2: 95% 96%    Intake/Output Summary (Last 24 hours) at 08/10/2020 1322 Last data filed at 08/10/2020 0859 Gross per 24 hour  Intake 360 ml  Output 2255 ml  Net -1895 ml   Filed Weights   08/07/20 1320 08/07/20 1705 08/10/20 0500  Weight: 77.5 kg 75.7 kg 75.7 kg   Body mass index is 22.02 kg/m.   Physical Exam: Pleasant male sitting up in bed, AAOx3, no distress HEENT: Right IJ dialysis catheter noted, icterus improving CVS: S1-S2, regular rate rhythm Lungs: Clear bilaterally Abdomen: Soft, mildly distended, less tender, bowel sounds present Extremities: No edema   Data Review: I have personally reviewed the following laboratory data and studies,  CBC: Recent Labs  Lab 08/06/20 0331 08/07/20 0442 08/08/20 0350 08/09/20 0346 08/10/20 0450  WBC 7.8 8.8 9.2 9.8 9.4  HGB 9.2* 8.5* 9.1* 8.5* 8.4*  HCT 26.6* 24.6* 26.9* 25.5* 24.9*  MCV 94.3 94.6 96.1 96.6 95.8  PLT 177 182 249 262 287   Basic Metabolic Panel: Recent Labs  Lab 08/04/20 0303 08/04/20 0303 08/05/20 0418 08/05/20 0418 08/06/20 0331 08/07/20 0442 08/08/20 0350 08/09/20 0346 08/10/20 0450  NA 127*   < > 124*   < > 128* 126* 131* 128* 125*  K 4.9   < > 4.7   < > 4.3 4.9 4.4 4.4 4.0  CL 97*   < > 92*   < > 94* 92* 95* 92* 89*  CO2 21*   < > 21*   < > 26 23 26 24 25   GLUCOSE 90   < > 91   < > 97 98 115* 132* 144*  BUN 26*   < > 41*   < > 29* 40* 20 27* 31*  CREATININE 7.79*   < > 10.52*   < > 8.32* 10.94* 7.01* 9.11* 10.23*  CALCIUM 7.9*   < > 8.0*   < > 7.7* 7.9* 8.1* 7.9* 7.9*  MG 2.4  --  2.6*  --  2.3  --   --   --   --   PHOS  --   --  8.0*  --  7.8*  --  5.5* 5.2* 6.0*   < > = values in this interval not displayed.   Liver Function Tests: Recent Labs  Lab 08/06/20 0331 08/07/20 0442 08/08/20 0350 08/09/20 0346 08/10/20 0450  AST 46* 38 37 28 20  ALT 308* 227* 184* 137* 96*  ALKPHOS 164* 145* 185* 151* 140*  BILITOT 5.1*  4.7* 3.9* 3.2* 2.6*  PROT 5.4* 5.4* 6.1* 5.6* 5.7*  ALBUMIN 2.2* 2.2* 2.3*  2.3* 2.2*  2.2* 2.1*  2.1*   No results for input(s): LIPASE, AMYLASE in the last 168 hours. Recent Labs  Lab 08/04/20 0303 08/05/20 0418 08/06/20 0331 08/07/20 0442 08/08/20 0350  AMMONIA 46* 33 30 31 28    Cardiac Enzymes: No results for input(s): CKTOTAL, CKMB, CKMBINDEX, TROPONINI in the last 168 hours. BNP (last  3 results) Recent Labs    08/09/20 1515  BNP 2,462.3*    ProBNP (last 3 results) No results for input(s): PROBNP in the last 8760 hours.  CBG: No results for input(s): GLUCAP in the last 168 hours. No results found for this or any previous visit (from the past 240 hour(s)).   Studies: CT ABDOMEN PELVIS WO CONTRAST  Result Date: 08/09/2020 CLINICAL DATA:  Abdominal pain with nausea and vomiting. EXAM: CT ABDOMEN AND PELVIS WITHOUT CONTRAST TECHNIQUE: Multidetector CT imaging of the abdomen and pelvis was performed following the standard protocol without IV contrast. COMPARISON:  July 28, 2020 FINDINGS: Lower chest: Marked severity atelectasis and/or infiltrate is seen within the bilateral lung bases. Small bilateral pleural effusions are noted, right greater than left. Hepatobiliary: No focal liver abnormality is seen. No gallstones, gallbladder wall thickening, or biliary dilatation. Pancreas: Unremarkable. No pancreatic ductal dilatation or surrounding inflammatory changes. Spleen: Normal in size without focal abnormality. Adrenals/Urinary Tract: Adrenal glands are unremarkable. Kidneys are normal in size without focal lesions. A 4 mm nonobstructing renal stone is seen within the mid right kidney. 2 mm and 3 mm nonobstructing renal stones are seen within the left kidney. Bladder is unremarkable. Stomach/Bowel: Stomach is within normal limits. Appendix appears normal. No evidence of bowel wall thickening, distention, or inflammatory changes. Vascular/Lymphatic: No significant vascular findings  are present. No enlarged abdominal or pelvic lymph nodes. Reproductive: Prostate is unremarkable. Other: A marked amount of edema is seen along the posterior wall of the lower abdomen and pelvis. There is a small amount of abdominal and pelvic free fluid. Musculoskeletal: No acute or significant osseous findings. IMPRESSION: 1. Marked severity bibasilar atelectasis and/or infiltrate. 2. Small bilateral pleural effusions, right greater than left. 3. Bilateral subcentimeter non-obstructing renal stones. 4. Small amount of abdominal and pelvic free fluid. Electronically Signed   By: Aram Candela M.D.   On: 08/09/2020 15:38   DG Chest 2 View  Result Date: 08/08/2020 CLINICAL DATA:  39 year old male with shortness of breath. EXAM: CHEST - 2 VIEW COMPARISON:  Chest radiograph dated 08/02/2020. FINDINGS: Right IJ central venous line with tip over central SVC in similar position. There is mild cardiomegaly with vascular congestion and edema and small bilateral pleural effusions. Pneumonia is not excluded. Clinical correlation is recommended. Overall overall slight progression of the edema compared to the prior radiograph. No pneumothorax. No acute osseous pathology. IMPRESSION: Cardiomegaly with findings of CHF and small bilateral pleural effusions, slightly progressed since the prior radiograph. Electronically Signed   By: Elgie Collard M.D.   On: 08/08/2020 22:41    Zannie Cove, MD  [facility] 08/10/2020

## 2020-08-11 LAB — RENAL FUNCTION PANEL
Albumin: 2.2 g/dL — ABNORMAL LOW (ref 3.5–5.0)
Anion gap: 12 (ref 5–15)
BUN: 34 mg/dL — ABNORMAL HIGH (ref 6–20)
CO2: 25 mmol/L (ref 22–32)
Calcium: 8.1 mg/dL — ABNORMAL LOW (ref 8.9–10.3)
Chloride: 89 mmol/L — ABNORMAL LOW (ref 98–111)
Creatinine, Ser: 10.82 mg/dL — ABNORMAL HIGH (ref 0.61–1.24)
GFR calc Af Amer: 6 mL/min — ABNORMAL LOW (ref >60)
GFR calc non Af Amer: 5 mL/min — ABNORMAL LOW (ref >60)
Glucose, Bld: 97 mg/dL (ref 70–99)
Phosphorus: 6.5 mg/dL — ABNORMAL HIGH (ref 2.5–4.6)
Potassium: 4 mmol/L (ref 3.5–5.1)
Sodium: 126 mmol/L — ABNORMAL LOW (ref 135–145)

## 2020-08-11 LAB — CBC
HCT: 25.8 % — ABNORMAL LOW (ref 39.0–52.0)
Hemoglobin: 8.8 g/dL — ABNORMAL LOW (ref 13.0–17.0)
MCH: 32.1 pg (ref 26.0–34.0)
MCHC: 34.1 g/dL (ref 30.0–36.0)
MCV: 94.2 fL (ref 80.0–100.0)
Platelets: 334 10*3/uL (ref 150–400)
RBC: 2.74 MIL/uL — ABNORMAL LOW (ref 4.22–5.81)
RDW: 13.2 % (ref 11.5–15.5)
WBC: 10.8 10*3/uL — ABNORMAL HIGH (ref 4.0–10.5)
nRBC: 0 % (ref 0.0–0.2)

## 2020-08-11 LAB — HEPATIC FUNCTION PANEL
ALT: 80 U/L — ABNORMAL HIGH (ref 0–44)
AST: 20 U/L (ref 15–41)
Albumin: 2.3 g/dL — ABNORMAL LOW (ref 3.5–5.0)
Alkaline Phosphatase: 142 U/L — ABNORMAL HIGH (ref 38–126)
Bilirubin, Direct: 1.5 mg/dL — ABNORMAL HIGH (ref 0.0–0.2)
Indirect Bilirubin: 1.2 mg/dL — ABNORMAL HIGH (ref 0.3–0.9)
Total Bilirubin: 2.7 mg/dL — ABNORMAL HIGH (ref 0.3–1.2)
Total Protein: 6 g/dL — ABNORMAL LOW (ref 6.5–8.1)

## 2020-08-11 MED ORDER — DARBEPOETIN ALFA 60 MCG/0.3ML IJ SOSY
60.0000 ug | PREFILLED_SYRINGE | INTRAMUSCULAR | Status: DC
  Administered 2020-08-11: 60 ug via SUBCUTANEOUS
  Filled 2020-08-11: qty 0.3

## 2020-08-11 MED ORDER — ALBUMIN HUMAN 25 % IV SOLN
25.0000 g | Freq: Four times a day (QID) | INTRAVENOUS | Status: AC
  Administered 2020-08-11 – 2020-08-12 (×3): 25 g via INTRAVENOUS
  Filled 2020-08-11 (×3): qty 100

## 2020-08-11 NOTE — Progress Notes (Signed)
PROGRESS NOTE  Paul Reese IFO:[id number] DOB: August 23, 1981 DOA: 07/28/2020 PCP: Pennie Banter, MD   LOS: 14 days   Brief narrative: 38/M with history of alcohol and polysubstance abuse presented to the ED with unintentional Tylenol overdose.  In addition also is drinking 6 to 12 pack of beer every day -Presented to the ED on 8/8 with abdominal pain, cramps and discomfort -The emergency room was noted to have abnormal LFTs with AST and ALT greater than 10,000, bilirubin was 5.6 creatinine was 2.2 ammonia level is 40, platelet count was 95K and INR was 2.5 -Resident admitted with acute liver injury from Tylenol toxicity and ATN likely from hyper bilirubinemia -Treated with N acetylcysteine per poison control protocol in the ICU, [facility] transplant team was consulted on 8/9, not felt to be appropriate/candidate for transplant -Had progressive worsening of kidney function, treated with CRRT in the ICU then transition to intermittent HD -Now urine output improving, hopeful for renal recovery  Assessment/Plan:  Acute liver injury  Unintentional Tylenol overdose  Alcoholic liver disease  -Acute hepatitis panel was negative on admission -Declined by [facility] transplant team -Treated with NAC -LFTs improving, bili down to 2.7 today -Alcohol cessation counseled  Acute kidney injury/ATN -Suspected to be secondary to acute liver failure, Tylenol toxicity, creatinine progressively worsened from 2.2 on admission up to 9/10, baseline creatinine is 1.0 from 07/09/2020 in care everywhere -Started on CRRT in the ICU, then intermittent hemodialysis -Last HD was 8/19, urine output 3.2L in past 24 hours -Hopeful for renal recovery, improving urine output, no indication for dialysis, creatinine starting to plateau -Plan to hold off on tunneled HD cath -BMP in a.m.  Nausea vomiting, abdominal pain Gastritis/esophageal ulcer, positive H. pylori - endoscopy 8/17 with above findings, felt to be consistent  with healing Mallory-Weiss tear -Continue PPI -Started clarithromycin and amoxicillin for H. pylori on 8/20 -Follow-up with gastroenterology as outpatient for alcoholic liver disease and above -Pain likely secondary to above and fluid overload/constipation now improving  Acute hypoxic failure has improved.  Currently on room air.  Metabolic encephalopathy improving.  Hyponatremia.   -Improving  Normocytic anemia:  -Stable, monitor   Substance use disorder: history of ETOH and cocaine use. Presented with elevated ETOH but negative UDS.  DVT prophylaxis: Lovenox Code Status: Full code  Family Communication: None Status is: Inpatient  Remains inpatient appropriate because:IV treatments appropriate due to intensity of illness or inability to take PO and Inpatient level of care appropriate due to severity of illness, severe renal failure  Dispo: The patient is from: Home              Anticipated d/c is to: Home              Anticipated d/c date is: To be determined, pending renal recovery              Patient currently is not medically stable to d/c.   Consultants:  GI  PCCM  Nephrology  Procedures: 8/12 Started CRRT 8/14 changed to CRRT 12h / day  Right internal jugular hemodialysis catheter placement Foley catheter placement Upper GI endoscopy on 08/05/2020 Hemodialysis planned for 08/05/2020  Antibiotics:   None  Anti-infectives (From admission, onward)   Start     Dose/Rate Route Frequency Ordered Stop   08/08/20 1500  amoxicillin (AMOXIL) capsule 500 mg        500 mg Oral Every 12 hours 08/08/20 1431     08/08/20 1500  clarithromycin (BIAXIN) tablet 250 mg  250 mg Oral Every 12 hours 08/08/20 1431     08/07/20 0600  ceFAZolin (ANCEF) IVPB 2g/100 mL premix        2 g 200 mL/hr over 30 Minutes Intravenous To Radiology 08/07/20 0352 08/08/20 0600      Subjective: -In good spirits today breathing is improving overall start swelling starting to improve  as well except in his lower back  Objective: Vitals:   08/11/20 0422 08/11/20 0900  BP: (!) 137/94 (!) 132/100  Pulse: 82 83  Resp: 20 18  Temp: 98.5 F (36.9 C) 98.6 F (37 C)  SpO2: 93% 92%    Intake/Output Summary (Last 24 hours) at 08/11/2020 1222 Last data filed at 08/11/2020 0900 Gross per 24 hour  Intake 240 ml  Output 3425 ml  Net -3185 ml   Filed Weights   08/07/20 1705 08/10/20 0500 08/11/20 0424  Weight: 75.7 kg 75.7 kg 83.9 kg   Body mass index is 24.41 kg/m.   Physical Exam: Pleasant male sitting up in bed, awake alert oriented x3, no distress HEENT: Right IJ central line/dialysis catheter noted, icterus improving CVS: S1-S2, regular rate rhythm Lungs: Clear bilaterally Abdomen: Soft, nondistended, nontender, bowel sounds present Extremities: No edema   Data Review: I have personally reviewed the following laboratory data and studies,  CBC: Recent Labs  Lab 08/07/20 0442 08/08/20 0350 08/09/20 0346 08/10/20 0450 08/11/20 0340  WBC 8.8 9.2 9.8 9.4 10.8*  HGB 8.5* 9.1* 8.5* 8.4* 8.8*  HCT 24.6* 26.9* 25.5* 24.9* 25.8*  MCV 94.6 96.1 96.6 95.8 94.2  PLT 182 249 262 287 334   Basic Metabolic Panel: Recent Labs  Lab 08/05/20 0418 08/05/20 0418 08/06/20 0331 08/06/20 0331 08/07/20 0442 08/08/20 0350 08/09/20 0346 08/10/20 0450 08/11/20 0340  NA 124*   < > 128*   < > 126* 131* 128* 125* 126*  K 4.7   < > 4.3   < > 4.9 4.4 4.4 4.0 4.0  CL 92*   < > 94*   < > 92* 95* 92* 89* 89*  CO2 21*   < > 26   < > 23 26 24 25 25   GLUCOSE 91   < > 97   < > 98 115* 132* 144* 97  BUN 41*   < > 29*   < > 40* 20 27* 31* 34*  CREATININE 10.52*   < > 8.32*   < > 10.94* 7.01* 9.11* 10.23* 10.82*  CALCIUM 8.0*   < > 7.7*   < > 7.9* 8.1* 7.9* 7.9* 8.1*  MG 2.6*  --  2.3  --   --   --   --   --   --   PHOS 8.0*   < > 7.8*  --   --  5.5* 5.2* 6.0* 6.5*   < > = values in this interval not displayed.   Liver Function Tests: Recent Labs  Lab 08/07/20 0442  08/08/20 0350 08/09/20 0346 08/10/20 0450 08/11/20 0340  AST 38 37 28 20 20   ALT 227* 184* 137* 96* 80*  ALKPHOS 145* 185* 151* 140* 142*  BILITOT 4.7* 3.9* 3.2* 2.6* 2.7*  PROT 5.4* 6.1* 5.6* 5.7* 6.0*  ALBUMIN 2.2* 2.3*   2.3* 2.2*   2.2* 2.1*   2.1* 2.3*   2.2*   No results for input(s): LIPASE, AMYLASE in the last 168 hours. Recent Labs  Lab 08/05/20 0418 08/06/20 0331 08/07/20 0442 08/08/20 0350  AMMONIA 33 30 31 28    Cardiac  Enzymes: No results for input(s): CKTOTAL, CKMB, CKMBINDEX, TROPONINI in the last 168 hours. BNP (last 3 results) Recent Labs    08/09/20 1515  BNP 2,462.3*    ProBNP (last 3 results) No results for input(s): PROBNP in the last 8760 hours.  CBG: No results for input(s): GLUCAP in the last 168 hours. No results found for this or any previous visit (from the past 240 hour(s)).   Studies: CT ABDOMEN PELVIS WO CONTRAST  Result Date: 08/09/2020 CLINICAL DATA:  Abdominal pain with nausea and vomiting. EXAM: CT ABDOMEN AND PELVIS WITHOUT CONTRAST TECHNIQUE: Multidetector CT imaging of the abdomen and pelvis was performed following the standard protocol without IV contrast. COMPARISON:  July 28, 2020 FINDINGS: Lower chest: Marked severity atelectasis and/or infiltrate is seen within the bilateral lung bases. Small bilateral pleural effusions are noted, right greater than left. Hepatobiliary: No focal liver abnormality is seen. No gallstones, gallbladder wall thickening, or biliary dilatation. Pancreas: Unremarkable. No pancreatic ductal dilatation or surrounding inflammatory changes. Spleen: Normal in size without focal abnormality. Adrenals/Urinary Tract: Adrenal glands are unremarkable. Kidneys are normal in size without focal lesions. A 4 mm nonobstructing renal stone is seen within the mid right kidney. 2 mm and 3 mm nonobstructing renal stones are seen within the left kidney. Bladder is unremarkable. Stomach/Bowel: Stomach is within normal limits.  Appendix appears normal. No evidence of bowel wall thickening, distention, or inflammatory changes. Vascular/Lymphatic: No significant vascular findings are present. No enlarged abdominal or pelvic lymph nodes. Reproductive: Prostate is unremarkable. Other: A marked amount of edema is seen along the posterior wall of the lower abdomen and pelvis. There is a small amount of abdominal and pelvic free fluid. Musculoskeletal: No acute or significant osseous findings. IMPRESSION: 1. Marked severity bibasilar atelectasis and/or infiltrate. 2. Small bilateral pleural effusions, right greater than left. 3. Bilateral subcentimeter non-obstructing renal stones. 4. Small amount of abdominal and pelvic free fluid. Electronically Signed   By: Aram Candela M.D.   On: 08/09/2020 15:38    Zannie Cove, MD  [facility] 08/11/2020

## 2020-08-11 NOTE — Progress Notes (Addendum)
[facility] NEPHROLOGY PROGRESS NOTE  Assessment/ Plan: Pt is a 39 y.o. yo male with history of alcohol and polysubstance abuse who was admitted with unintentional Tylenol overdose causing acute liver failure and acute kidney injury required dialysis.  #Severe acute kidney injury, dialysis dependent: Due to acute liver failure in the setting of Tylenol overdose.  Presumed normal baseline creatinine level.  Kidney ultrasound with echogenic kidneys without hydronephrosis.  Last dialysis was on 8/19. He is nonoliguric however very slow renal recovery.  The creatinine level actually went up today however clinically he looks stable.  I will order IV albumin.  Plan to hold off dialysis today and watch for renal recovery.  #Tylenol overdose/acute liver failure: Treated medically.  GI and primary team managed.  Improving.  #Hyponatremia, hypervolemic: Due to reduced free water excretion.  Continue loop diuretics and monitor lab.  # Anemia, normocytic: With some GI loss, renal failure: Received iron, and start ESA on 8/23.  Monitor hemoglobin.  #Hypertension:Continue current antihypertensive medication.  Volume status managed with diuretics.  Subjective: Seen and examined at bedside.  Denies nausea vomiting chest pain shortness of breath.  Urine output 3.2 L. Objective Vital signs in last 24 hours: Vitals:   08/10/20 2032 08/11/20 0422 08/11/20 0424 08/11/20 0900  BP: (!) 141/102 (!) 137/94  (!) 132/100  Pulse: 85 82  83  Resp: 20 20  18   Temp: 98.6 F (37 C) 98.5 F (36.9 C)  98.6 F (37 C)  TempSrc: Oral Oral  Oral  SpO2: 99% 93%  92%  Weight:   83.9 kg   Height:       Weight change: 8.215 kg  Intake/Output Summary (Last 24 hours) at 08/11/2020 1435 Last data filed at 08/11/2020 1430 Gross per 24 hour  Intake 240 ml  Output 4000 ml  Net -3760 ml       Labs: Basic Metabolic Panel: Recent Labs  Lab 08/09/20 0346 08/10/20 0450 08/11/20 0340  NA 128* 125* 126*   K 4.4 4.0 4.0  CL 92* 89* 89*  CO2 24 25 25   GLUCOSE 132* 144* 97  BUN 27* 31* 34*  CREATININE 9.11* 10.23* 10.82*  CALCIUM 7.9* 7.9* 8.1*  PHOS 5.2* 6.0* 6.5*   Liver Function Tests: Recent Labs  Lab 08/09/20 0346 08/10/20 0450 08/11/20 0340  AST 28 20 20   ALT 137* 96* 80*  ALKPHOS 151* 140* 142*  BILITOT 3.2* 2.6* 2.7*  PROT 5.6* 5.7* 6.0*  ALBUMIN 2.2*  2.2* 2.1*  2.1* 2.3*  2.2*   No results for input(s): LIPASE, AMYLASE in the last 168 hours. Recent Labs  Lab 08/06/20 0331 08/07/20 0442 08/08/20 0350  AMMONIA 30 31 28    CBC: Recent Labs  Lab 08/07/20 0442 08/07/20 0442 08/08/20 0350 08/08/20 0350 08/09/20 0346 08/10/20 0450 08/11/20 0340  WBC 8.8   < > 9.2   < > 9.8 9.4 10.8*  HGB 8.5*   < > 9.1*   < > 8.5* 8.4* 8.8*  HCT 24.6*   < > 26.9*   < > 25.5* 24.9* 25.8*  MCV 94.6  --  96.1  --  96.6 95.8 94.2  PLT 182   < > 249   < > 262 287 334   < > = values in this interval not displayed.   Cardiac Enzymes: No results for input(s): CKTOTAL, CKMB, CKMBINDEX, TROPONINI in the last 168 hours. CBG: No results for input(s): GLUCAP in the last 168 hours.  Iron Studies: No results for  input(s): IRON, TIBC, TRANSFERRIN, FERRITIN in the last 72 hours. Studies/Results: No results found.  Medications: Infusions:   Scheduled Medications: . amLODipine  5 mg Oral Daily  . amoxicillin  500 mg Oral Q12H  . Chlorhexidine Gluconate Cloth  6 each Topical Q0600  . clarithromycin  250 mg Oral Q12H  . enoxaparin (LOVENOX) injection  30 mg Subcutaneous Q24H  . feeding supplement  1 Container Oral BID BM  . furosemide  80 mg Intravenous BID  . pantoprazole  40 mg Oral BID  . polyethylene glycol  17 g Oral BID  . senna-docusate  1 tablet Oral BID  . sodium chloride flush  10-40 mL Intracatheter Q12H    have reviewed scheduled and prn medications.  Physical Exam: General:NAD, comfortable Heart:RRR, s1s2 nl Lungs:clear b/l, no crackle Abdomen:soft,  Non-tender, non-distended Extremities:No edema Dialysis Access: Has temporary catheter.  Erianna Jolly Jaynie Collins 08/11/2020,2:35 PM  LOS: 14 days  Pager: [phone]

## 2020-08-12 LAB — RENAL FUNCTION PANEL
Albumin: 2.8 g/dL — ABNORMAL LOW (ref 3.5–5.0)
Anion gap: 13 (ref 5–15)
BUN: 37 mg/dL — ABNORMAL HIGH (ref 6–20)
CO2: 27 mmol/L (ref 22–32)
Calcium: 8.5 mg/dL — ABNORMAL LOW (ref 8.9–10.3)
Chloride: 88 mmol/L — ABNORMAL LOW (ref 98–111)
Creatinine, Ser: 10.76 mg/dL — ABNORMAL HIGH (ref 0.61–1.24)
GFR calc Af Amer: 6 mL/min — ABNORMAL LOW (ref >60)
GFR calc non Af Amer: 5 mL/min — ABNORMAL LOW (ref >60)
Glucose, Bld: 98 mg/dL (ref 70–99)
Phosphorus: 6.7 mg/dL — ABNORMAL HIGH (ref 2.5–4.6)
Potassium: 3.5 mmol/L (ref 3.5–5.1)
Sodium: 128 mmol/L — ABNORMAL LOW (ref 135–145)

## 2020-08-12 MED ORDER — MUPIROCIN CALCIUM 2 % EX CREA
TOPICAL_CREAM | Freq: Two times a day (BID) | CUTANEOUS | Status: DC
  Filled 2020-08-12: qty 15

## 2020-08-12 NOTE — Progress Notes (Signed)
[facility] NEPHR[facility] PROGRESS NOTE  Assessment/ Plan: Pt is a 39 y.o. yo male with history of alcohol and polysubstance abuse who was admitted with unintentional Tylenol overdose causing acute liver failure and acute kidney injury required dialysis.  #Severe acute kidney injury, dialysis dependent: Due to acute liver failure in the setting of Tylenol overdose.  Presumed normal baseline creatinine level.  Kidney ultrasound with echogenic kidneys without hydronephrosis.  Last dialysis was on 8/19. He is nonoliguric however very slow renal recovery.  Received IV albumin and now creatinine level plateau.  His volume status looks improved therefore I am holding Lasix.  Monitor lab daily.  No need for dialysis today.  #Tylenol overdose/acute liver failure: Treated medically.  GI and primary team managed.  Improving.  #Hyponatremia, hypervolemic: Due to reduced free water excretion.  Continue fluid restriction.  The edema has improved.  Holding Lasix as above.  # Anemia, normocytic: With some GI loss, renal failure: Received iron, and start ESA on 8/23.  Monitor hemoglobin.  #Hypertension:Continue current antihypertensive medication.    Subjective: Seen and examined at bedside.  Urine output 3.5 L.  Denies nausea vomiting chest pain shortness of breath.  Doing well.  No new event.  Objective Vital signs in last 24 hours: Vitals:   08/11/20 1826 08/11/20 2110 08/12/20 0451 08/12/20 0910  BP: (!) 156/109 (!) 136/93 121/88 (!) 137/96  Pulse: 93 91 79 82  Resp: 18 18 16 18   Temp: 99.1 F (37.3 C) 98.8 F (37.1 C) 98.8 F (37.1 C) 98.6 F (37 C)  TempSrc: Oral Oral Oral Oral  SpO2: 97% 98% 91% 94%  Weight:  84 kg    Height:       Weight change: 0.084 kg  Intake/Output Summary (Last 24 hours) at 08/12/2020 1208 Last data filed at 08/12/2020 1107 Gross per 24 hour  Intake 750 ml  Output 4150 ml  Net -3400 ml       Labs: Basic Metabolic Panel: Recent Labs  Lab  08/10/20 0450 08/11/20 0340 08/12/20 0458  NA 125* 126* 128*  K 4.0 4.0 3.5  CL 89* 89* 88*  CO2 25 25 27   GLUCOSE 144* 97 98  BUN 31* 34* 37*  CREATININE 10.23* 10.82* 10.76*  CALCIUM 7.9* 8.1* 8.5*  PHOS 6.0* 6.5* 6.7*   Liver Function Tests: Recent Labs  Lab 08/09/20 0346 08/09/20 0346 08/10/20 0450 08/11/20 0340 08/12/20 0458  AST 28  --  20 20  --   ALT 137*  --  96* 80*  --   ALKPHOS 151*  --  140* 142*  --   BILITOT 3.2*  --  2.6* 2.7*  --   PROT 5.6*  --  5.7* 6.0*  --   ALBUMIN 2.2*  2.2*   < > 2.1*  2.1* 2.3*  2.2* 2.8*   < > = values in this interval not displayed.   No results for input(s): LIPASE, AMYLASE in the last 168 hours. Recent Labs  Lab 08/06/20 0331 08/07/20 0442 08/08/20 0350  AMMONIA 30 31 28    CBC: Recent Labs  Lab 08/07/20 0442 08/07/20 0442 08/08/20 0350 08/08/20 0350 08/09/20 0346 08/10/20 0450 08/11/20 0340  WBC 8.8   < > 9.2   < > 9.8 9.4 10.8*  HGB 8.5*   < > 9.1*   < > 8.5* 8.4* 8.8*  HCT 24.6*   < > 26.9*   < > 25.5* 24.9* 25.8*  MCV 94.6  --  96.1  --  96.6 95.8 94.2  PLT 182   < > 249   < > 262 287 334   < > = values in this interval not displayed.   Cardiac Enzymes: No results for input(s): CKTOTAL, CKMB, CKMBINDEX, TROPONINI in the last 168 hours. CBG: No results for input(s): GLUCAP in the last 168 hours.  Iron Studies: No results for input(s): IRON, TIBC, TRANSFERRIN, FERRITIN in the last 72 hours. Studies/Results: No results found.  Medications: Infusions:   Scheduled Medications: . amLODipine  5 mg Oral Daily  . amoxicillin  500 mg Oral Q12H  . Chlorhexidine Gluconate Cloth  6 each Topical Q0600  . clarithromycin  250 mg Oral Q12H  . darbepoetin (ARANESP) injection - NON-DIALYSIS  60 mcg Subcutaneous Q Mon-1800  . enoxaparin (LOVENOX) injection  30 mg Subcutaneous Q24H  . feeding supplement  1 Container Oral BID BM  . pantoprazole  40 mg Oral BID  . polyethylene glycol  17 g Oral BID  .  senna-docusate  1 tablet Oral BID  . sodium chloride flush  10-40 mL Intracatheter Q12H    have reviewed scheduled and prn medications.  Physical Exam: General:NAD, comfortable Heart:RRR, s1s2 nl Lungs:clear b/l, no crackle Abdomen:soft, Non-tender, non-distended Extremities: No LE edema. Dialysis Access: Has temporary catheter.  Georgene Kopper Jaynie Collins 08/12/2020,12:08 PM  LOS: 15 days  Pager: [phone]

## 2020-08-12 NOTE — Progress Notes (Signed)
PROGRESS NOTE  [facility] HGD:[id number] DOB: May 09, 1981 DOA: 07/28/2020 PCP: Pennie Banter, MD  LOS: 15 days  Brief narrative: 38/M with history of alcohol and polysubstance abuse presented to the ED with unintentional Tylenol overdose.  In addition was also drinking 6 to 12 beers/day. -Presented to the ED on 8/8 with abdominal pain, cramps and discomfort -The emergency room was noted to have abnormal LFTs with AST and ALT greater than 10,000, bilirubin was 5.6 creatinine was 2.2 ,  INR was 2.5 -Resident admitted with acute liver injury from Tylenol toxicity and ATN  -Treated with N acetylcysteine per poison control protocol in the ICU, [facility] transplant team was consulted on 8/9, not felt to be appropriate/candidate for transplant/transfer. -liver function improving slowly, had progressive worsening of kidney function, treated with CRRT in the ICU then transitioned to intermittent HD -Now urine output improving, hopeful for renal recovery, last HD 8/19  Assessment/Plan:  Acute liver injury  Unintentional Tylenol overdose  Alcoholic liver disease  -Acute hepatitis panel was negative on admission -Declined by [facility] transplant team -Treated with NAC -LFTs improving, bili down to 2.7  -Alcohol cessation counseled  Acute kidney injury/ATN -secondary to acute liver failure, Tylenol toxicity, creatinine progressively worsened from 2.2 on admission up to 9/10, baseline creatinine is 1.0 from 07/09/2020 in care everywhere,  -Started on CRRT in the ICU, then intermittent hemodialysis -Last HD was 8/19, now improving, urine output 3.5L yesterday -Hopeful for renal recovery, no indication for dialysis, creatinine starting to plateau -Plan to hold off on tunneled HD cath -BMP in a.m.  Nausea vomiting, abdominal pain Gastritis/esophageal ulcer, positive H. pylori - endoscopy 8/17 with above findings, felt to be consistent with healing Mallory-Weiss tear -Continue PPI -Started  clarithromycin and amoxicillin for H. pylori on 8/20 -Follow-up with gastroenterology as outpatient for alcoholic liver disease and above -Pain likely secondary to above and fluid overload/constipation now improving  Acute hypoxic failure has improved.  Currently on room air.  Metabolic encephalopathy Uremia -resolved  Hyponatremia.   -Improving  Normocytic anemia:  -Stable, monitor   Substance use disorder: history of ETOH and cocaine use. Presented with elevated ETOH but negative UDS.  DVT prophylaxis: Lovenox Code Status: Full code  Family Communication: None Status is: Inpatient  Remains inpatient appropriate because:IV treatments appropriate due to intensity of illness or inability to take PO and Inpatient level of care appropriate due to severity of illness, severe renal failure  Dispo: The patient is from: Home              Anticipated d/c is to: Home              Anticipated d/c date is: To be determined, pending renal recovery              Patient currently is not medically stable to d/c.   Consultants:  GI  PCCM  Nephrology  Procedures: 8/12 Started CRRT 8/14 changed to CRRT 12h / day  Right internal jugular hemodialysis catheter placement Foley catheter placement Upper GI endoscopy on 08/05/2020 Hemodialysis planned for 08/05/2020  Antibiotics:  . None  Anti-infectives (From admission, onward)   Start     Dose/Rate Route Frequency Ordered Stop   08/08/20 1500  amoxicillin (AMOXIL) capsule 500 mg        500 mg Oral Every 12 hours 08/08/20 1431     08/08/20 1500  clarithromycin (BIAXIN) tablet 250 mg        250 mg Oral Every 12 hours 08/08/20 1431  08/07/20 0600  ceFAZolin (ANCEF) IVPB 2g/100 mL premix        2 g 200 mL/hr over 30 Minutes Intravenous To Radiology 08/07/20 0352 08/08/20 0600      Subjective: Feels well, no events overnight breathing better, no nausea vomiting  Objective: Vitals:   08/12/20 0451 08/12/20 0910  BP: 121/88  (!) 137/96  Pulse: 79 82  Resp: 16 18  Temp: 98.8 F (37.1 C) 98.6 F (37 C)  SpO2: 91% 94%    Intake/Output Summary (Last 24 hours) at 08/12/2020 1413 Last data filed at 08/12/2020 1107 Gross per 24 hour  Intake 750 ml  Output 3050 ml  Net -2300 ml   Filed Weights   08/10/20 0500 08/11/20 0424 08/11/20 2110  Weight: 75.7 kg 83.9 kg 84 kg   Body mass index is 24.43 kg/m.   Physical Exam: Pleasant male sitting up in bed, AAOx3, no distress HEENT: Right IJ central line/dialysis catheter noted, icterus improving CVS: S1-S2, regular rate rhythm Lungs: Clear bilaterally Abdomen: Soft, nontender, nondistended, bowel sounds present Extremities: No edema   Data Review: I have personally reviewed the following laboratory data and studies,  CBC: Recent Labs  Lab 08/07/20 0442 08/08/20 0350 08/09/20 0346 08/10/20 0450 08/11/20 0340  WBC 8.8 9.2 9.8 9.4 10.8*  HGB 8.5* 9.1* 8.5* 8.4* 8.8*  HCT 24.6* 26.9* 25.5* 24.9* 25.8*  MCV 94.6 96.1 96.6 95.8 94.2  PLT 182 249 262 287 334   Basic Metabolic Panel: Recent Labs  Lab 08/06/20 0331 08/07/20 0442 08/08/20 0350 08/09/20 0346 08/10/20 0450 08/11/20 0340 08/12/20 0458  NA 128*   < > 131* 128* 125* 126* 128*  K 4.3   < > 4.4 4.4 4.0 4.0 3.5  CL 94*   < > 95* 92* 89* 89* 88*  CO2 26   < > 26 24 25 25 27   GLUCOSE 97   < > 115* 132* 144* 97 98  BUN 29*   < > 20 27* 31* 34* 37*  CREATININE 8.32*   < > 7.01* 9.11* 10.23* 10.82* 10.76*  CALCIUM 7.7*   < > 8.1* 7.9* 7.9* 8.1* 8.5*  MG 2.3  --   --   --   --   --   --   PHOS 7.8*  --  5.5* 5.2* 6.0* 6.5* 6.7*   < > = values in this interval not displayed.   Liver Function Tests: Recent Labs  Lab 08/07/20 0442 08/07/20 0442 08/08/20 0350 08/09/20 0346 08/10/20 0450 08/11/20 0340 08/12/20 0458  AST 38  --  37 28 20 20   --   ALT 227*  --  184* 137* 96* 80*  --   ALKPHOS 145*  --  185* 151* 140* 142*  --   BILITOT 4.7*  --  3.9* 3.2* 2.6* 2.7*  --   PROT 5.4*  --   6.1* 5.6* 5.7* 6.0*  --   ALBUMIN 2.2*   < > 2.3*  2.3* 2.2*  2.2* 2.1*  2.1* 2.3*  2.2* 2.8*   < > = values in this interval not displayed.   No results for input(s): LIPASE, AMYLASE in the last 168 hours. Recent Labs  Lab 08/06/20 0331 08/07/20 0442 08/08/20 0350  AMMONIA 30 31 28    Cardiac Enzymes: No results for input(s): CKTOTAL, CKMB, CKMBINDEX, TROPONINI in the last 168 hours. BNP (last 3 results) Recent Labs    08/09/20 1515  BNP 2,462.3*   ProBNP (last 3 results) No results for input(s):  PROBNP in the last 8760 hours.  CBG: No results for input(s): GLUCAP in the last 168 hours. No results found for this or any previous visit (from the past 240 hour(s)).   Studies: No results found.  Zannie Cove, MD  [facility] 08/12/2020

## 2020-08-13 LAB — RENAL FUNCTION PANEL
Albumin: 2.7 g/dL — ABNORMAL LOW (ref 3.5–5.0)
Anion gap: 11 (ref 5–15)
BUN: 36 mg/dL — ABNORMAL HIGH (ref 6–20)
CO2: 29 mmol/L (ref 22–32)
Calcium: 8.5 mg/dL — ABNORMAL LOW (ref 8.9–10.3)
Chloride: 89 mmol/L — ABNORMAL LOW (ref 98–111)
Creatinine, Ser: 8.94 mg/dL — ABNORMAL HIGH (ref 0.61–1.24)
GFR calc Af Amer: 8 mL/min — ABNORMAL LOW (ref >60)
GFR calc non Af Amer: 7 mL/min — ABNORMAL LOW (ref >60)
Glucose, Bld: 135 mg/dL — ABNORMAL HIGH (ref 70–99)
Phosphorus: 6.1 mg/dL — ABNORMAL HIGH (ref 2.5–4.6)
Potassium: 3.3 mmol/L — ABNORMAL LOW (ref 3.5–5.1)
Sodium: 129 mmol/L — ABNORMAL LOW (ref 135–145)

## 2020-08-13 LAB — HEPATIC FUNCTION PANEL
ALT: 46 U/L — ABNORMAL HIGH (ref 0–44)
AST: 20 U/L (ref 15–41)
Albumin: 2.8 g/dL — ABNORMAL LOW (ref 3.5–5.0)
Alkaline Phosphatase: 137 U/L — ABNORMAL HIGH (ref 38–126)
Bilirubin, Direct: 1.1 mg/dL — ABNORMAL HIGH (ref 0.0–0.2)
Indirect Bilirubin: 1 mg/dL — ABNORMAL HIGH (ref 0.3–0.9)
Total Bilirubin: 2.1 mg/dL — ABNORMAL HIGH (ref 0.3–1.2)
Total Protein: 6.1 g/dL — ABNORMAL LOW (ref 6.5–8.1)

## 2020-08-13 MED ORDER — POTASSIUM CHLORIDE CRYS ER 20 MEQ PO TBCR
20.0000 meq | EXTENDED_RELEASE_TABLET | Freq: Once | ORAL | Status: AC
  Administered 2020-08-13: 20 meq via ORAL
  Filled 2020-08-13: qty 1

## 2020-08-13 NOTE — Progress Notes (Signed)
Patient's bed was high up, when asked he wants to be his bed high for his eyes and said he put all his bed rails up. Will continue to monitor.

## 2020-08-13 NOTE — Progress Notes (Signed)
[facility] NEPHROLOGY PROGRESS NOTE  Assessment/ Plan: Pt is a 39 y.o. yo male with history of alcohol and polysubstance abuse who was admitted with unintentional Tylenol overdose causing acute liver failure and acute kidney injury required dialysis.  #Severe acute kidney injury, dialysis dependent: Due to acute liver failure in the setting of Tylenol overdose.  Presumed normal baseline creatinine level.  Kidney ultrasound with echogenic kidneys without hydronephrosis.  Last dialysis was on 8/19. Urine output is increasing(auto diuresis) and creatinine level trending down.  Seems like he is having renal recovery.  No plan for dialysis.  Clinically asymptomatic.  If his creatinine level continue to improve by tomorrow then we will discontinue the temporary HD catheter.  #Tylenol overdose/acute liver failure: Treated medically.  GI and primary team managed.  Improving.  #Hyponatremia, hypervolemic: Due to reduced free water excretion.  Continue fluid restriction.  The edema has improved.  Holding Lasix as above.  # Anemia, normocytic: With some GI loss, renal failure: Received iron, and start ESA on 8/23.  Monitor hemoglobin.  #Hypertension:Continue current antihypertensive medication.    Subjective: Seen and examined at bedside.  Urine output 5.4 L.  Denies nausea vomiting chest pain shortness of breath.  Doing well.  No new event.  Objective Vital signs in last 24 hours: Vitals:   08/12/20 1656 08/12/20 2042 08/13/20 0548 08/13/20 1006  BP: 126/85 (!) 144/106 (!) 136/98 129/82  Pulse: 83 93 84 80  Resp: 18 18 18 18   Temp: 98.6 F (37 C) 98.8 F (37.1 C) 98 F (36.7 C) 98.8 F (37.1 C)  TempSrc:  Oral Oral Oral  SpO2: 96% 97% 96% 92%  Weight:      Height:       Weight change:   Intake/Output Summary (Last 24 hours) at 08/13/2020 1214 Last data filed at 08/13/2020 1100 Gross per 24 hour  Intake 960 ml  Output 5100 ml  Net -4140 ml       Labs: Basic  Metabolic Panel: Recent Labs  Lab 08/11/20 0340 08/12/20 0458 08/13/20 0459  NA 126* 128* 129*  K 4.0 3.5 3.3*  CL 89* 88* 89*  CO2 25 27 29   GLUCOSE 97 98 135*  BUN 34* 37* 36*  CREATININE 10.82* 10.76* 8.94*  CALCIUM 8.1* 8.5* 8.5*  PHOS 6.5* 6.7* 6.1*   Liver Function Tests: Recent Labs  Lab 08/10/20 0450 08/10/20 0450 08/11/20 0340 08/12/20 0458 08/13/20 0459  AST 20  --  20  --  20  ALT 96*  --  80*  --  46*  ALKPHOS 140*  --  142*  --  137*  BILITOT 2.6*  --  2.7*  --  2.1*  PROT 5.7*  --  6.0*  --  6.1*  ALBUMIN 2.1*  2.1*   < > 2.3*  2.2* 2.8* 2.8*  2.7*   < > = values in this interval not displayed.   No results for input(s): LIPASE, AMYLASE in the last 168 hours. Recent Labs  Lab 08/07/20 0442 08/08/20 0350  AMMONIA 31 28   CBC: Recent Labs  Lab 08/07/20 0442 08/07/20 0442 08/08/20 0350 08/08/20 0350 08/09/20 0346 08/10/20 0450 08/11/20 0340  WBC 8.8   < > 9.2   < > 9.8 9.4 10.8*  HGB 8.5*   < > 9.1*   < > 8.5* 8.4* 8.8*  HCT 24.6*   < > 26.9*   < > 25.5* 24.9* 25.8*  MCV 94.6  --  96.1  --  96.6 95.8 94.2  PLT 182   < > 249   < > 262 287 334   < > = values in this interval not displayed.   Cardiac Enzymes: No results for input(s): CKTOTAL, CKMB, CKMBINDEX, TROPONINI in the last 168 hours. CBG: No results for input(s): GLUCAP in the last 168 hours.  Iron Studies: No results for input(s): IRON, TIBC, TRANSFERRIN, FERRITIN in the last 72 hours. Studies/Results: No results found.  Medications: Infusions:   Scheduled Medications: . amLODipine  5 mg Oral Daily  . amoxicillin  500 mg Oral Q12H  . Chlorhexidine Gluconate Cloth  6 each Topical Q0600  . clarithromycin  250 mg Oral Q12H  . darbepoetin (ARANESP) injection - NON-DIALYSIS  60 mcg Subcutaneous Q Mon-1800  . enoxaparin (LOVENOX) injection  30 mg Subcutaneous Q24H  . feeding supplement  1 Container Oral BID BM  . mupirocin cream   Topical BID  . pantoprazole  40 mg Oral BID   . polyethylene glycol  17 g Oral BID  . potassium chloride  20 mEq Oral Once  . senna-docusate  1 tablet Oral BID  . sodium chloride flush  10-40 mL Intracatheter Q12H    have reviewed scheduled and prn medications.  Physical Exam: General:NAD, comfortable Heart:RRR, s1s2 nl Lungs:clear b/l, no crackle Abdomen:soft, Non-tender, non-distended Extremities: No LE edema. Dialysis Access: Has temporary catheter.  Paul Reese 08/13/2020,12:14 PM  LOS: 16 days  Pager: [phone]

## 2020-08-13 NOTE — Progress Notes (Signed)
PROGRESS NOTE    Paul Reese  IOM:[id number] DOB: [birth year] DOA: 07/28/2020 PCP: Pennie Banter, MD   Brief Narrative: 39 year old with past medical history significant for alcohol and polysubstance abuse presented to the ED with unintentional Tylenol overdose.  In addition he was also drinking 6-12 beers a day. -Presented to the ED on 8/8 with abdominal pain, cramps and discomfort.  Emergency room was noted to have abnormal liver function test with AST and ALT greater than that 10,000, bilirubin 5.6, creatinine was 2.2, INR 2.5. -Patient admitted with acute liver injury from Tylenol toxicity and ATN.  Treated with an acetylcysteine per poison control protocol in the ICU, [facility] transplant team was consulted on 8/9, not felt to be appropriate candidate for transplant/ transferred.  Liver function tests improving slowly, had progressive worsening kidney function, treated with CRRT in the ICU then transition to intermittent hemodialysis Now urine output improving, hopeful for renal recovery last hemodialysis 8/19.    Assessment & Plan:   Active Problems:   Liver failure (HCC)   Tylenol overdose   AKI (acute kidney injury) (HCC)   Colitis   Elevated INR   ETOH abuse   1-Acute liver injury, unintentional Tylenol overdose, alcoholic liver disease -Acute hepatitis panel was negative -Declined by [facility] transplant team -Treated with NAC -Liver function tests bilirubin continue to improve -Alcohol cessation counseled.   2-Acute kidney injury/ATN Secondary to acute liver failure, Tylenol toxicity. -Started on CRRT in the ICU then intermittent hemodialysis -Last hemodialysis was 8/19. -Monitoring for renal recovery.  Creatinine down to 8 from 10  3-Nausea, vomiting abdominal pain Gastritis /esophageal ulcer, positive H. Pylori -Endoscopy 8/17 with above findings for to be consistent with healing Mallory-Weiss tear -Continue with PPI -Started clarithromycin and amoxicillin for H.  pylori on 8/20 -Needs to follow-up with GI as an outpatient  4-Acute hypoxic respiratory failure: Resolved  5-Acute metabolic encephalopathy.  related to uremia. resolved 6-Hypomagnesemia; Corrected.   Normocytic anemia: Stable  Substance use disorder history of EtOH and cocaine use.  Counseling provided Hypokalemia: Replete orally Hyponatremia: Improving  Estimated body mass index is 24.43 kg/m as calculated from the following:   Height as of this encounter: 6\' 1"  (1.854 m).   Weight as of this encounter: 84 kg.   DVT prophylaxis: Lovenox Code Status: Full code Family Communication: care discussed with patient Disposition Plan:  Status is: Inpatient  Remains inpatient appropriate because:Persistent severe electrolyte disturbances   Dispo: The patient is from: Home              Anticipated d/c is to: Home              Anticipated d/c date is: 2 days              Patient currently is not medically stable to d/c.        Consultants:   Nephrology   Procedures:   HD  CCRT    Antimicrobials:    Subjective: He report pain right abdomen.   Objective: Vitals:   08/12/20 1656 08/12/20 2042 08/13/20 0548 08/13/20 1006  BP: 126/85 (!) 144/106 (!) 136/98 129/82  Pulse: 83 93 84 80  Resp: 18 18 18 18   Temp: 98.6 F (37 C) 98.8 F (37.1 C) 98 F (36.7 C) 98.8 F (37.1 C)  TempSrc:  Oral Oral Oral  SpO2: 96% 97% 96% 92%  Weight:      Height:        Intake/Output Summary (Last 24 hours) at 08/13/2020  1633 Last data filed at 08/13/2020 1545 Gross per 24 hour  Intake 720 ml  Output 4525 ml  Net -3805 ml   Filed Weights   08/10/20 0500 08/11/20 0424 08/11/20 2110  Weight: 75.7 kg 83.9 kg 84 kg    Examination:  General exam: Appears calm and comfortable  Respiratory system: Clear to auscultation. Respiratory effort normal. Cardiovascular system: S1 & S2 heard, RRR. No JVD, murmurs, rubs, gallops or clicks. No pedal edema. Gastrointestinal system:  Abdomen is nondistended, soft and nontender. No organomegaly or masses felt. Normal bowel sounds heard. Central nervous system: Alert and oriented.  Extremities: Symmetric 5 x 5 power.    Data Reviewed: I have personally reviewed following labs and imaging studies  CBC: Recent Labs  Lab 08/07/20 0442 08/08/20 0350 08/09/20 0346 08/10/20 0450 08/11/20 0340  WBC 8.8 9.2 9.8 9.4 10.8*  HGB 8.5* 9.1* 8.5* 8.4* 8.8*  HCT 24.6* 26.9* 25.5* 24.9* 25.8*  MCV 94.6 96.1 96.6 95.8 94.2  PLT 182 249 262 287 334   Basic Metabolic Panel: Recent Labs  Lab 08/09/20 0346 08/10/20 0450 08/11/20 0340 08/12/20 0458 08/13/20 0459  NA 128* 125* 126* 128* 129*  K 4.4 4.0 4.0 3.5 3.3*  CL 92* 89* 89* 88* 89*  CO2 24 25 25 27 29   GLUCOSE 132* 144* 97 98 135*  BUN 27* 31* 34* 37* 36*  CREATININE 9.11* 10.23* 10.82* 10.76* 8.94*  CALCIUM 7.9* 7.9* 8.1* 8.5* 8.5*  PHOS 5.2* 6.0* 6.5* 6.7* 6.1*   GFR: Estimated Creatinine Clearance: 12.4 mL/min (A) (by C-G formula based on SCr of 8.94 mg/dL (H)). Liver Function Tests: Recent Labs  Lab 08/08/20 0350 08/08/20 0350 08/09/20 0346 08/10/20 0450 08/11/20 0340 08/12/20 0458 08/13/20 0459  AST 37  --  28 20 20   --  20  ALT 184*  --  137* 96* 80*  --  46*  ALKPHOS 185*  --  151* 140* 142*  --  137*  BILITOT 3.9*  --  3.2* 2.6* 2.7*  --  2.1*  PROT 6.1*  --  5.6* 5.7* 6.0*  --  6.1*  ALBUMIN 2.3*  2.3*   < > 2.2*  2.2* 2.1*  2.1* 2.3*  2.2* 2.8* 2.8*  2.7*   < > = values in this interval not displayed.   No results for input(s): LIPASE, AMYLASE in the last 168 hours. Recent Labs  Lab 08/07/20 0442 08/08/20 0350  AMMONIA 31 28   Coagulation Profile: Recent Labs  Lab 08/07/20 0442 08/08/20 0350  INR 1.0 1.1   Cardiac Enzymes: No results for input(s): CKTOTAL, CKMB, CKMBINDEX, TROPONINI in the last 168 hours. BNP (last 3 results) No results for input(s): PROBNP in the last 8760 hours. HbA1C: No results for input(s): HGBA1C  in the last 72 hours. CBG: No results for input(s): GLUCAP in the last 168 hours. Lipid Profile: No results for input(s): CHOL, HDL, LDLCALC, TRIG, CHOLHDL, LDLDIRECT in the last 72 hours. Thyroid Function Tests: No results for input(s): TSH, T4TOTAL, FREET4, T3FREE, THYROIDAB in the last 72 hours. Anemia Panel: No results for input(s): VITAMINB12, FOLATE, FERRITIN, TIBC, IRON, RETICCTPCT in the last 72 hours. Sepsis Labs: No results for input(s): PROCALCITON, LATICACIDVEN in the last 168 hours.  No results found for this or any previous visit (from the past 240 hour(s)).       Radiology Studies: No results found.      Scheduled Meds: . amLODipine  5 mg Oral Daily  . amoxicillin  500  mg Oral Q12H  . Chlorhexidine Gluconate Cloth  6 each Topical Q0600  . clarithromycin  250 mg Oral Q12H  . darbepoetin (ARANESP) injection - NON-DIALYSIS  60 mcg Subcutaneous Q Mon-1800  . enoxaparin (LOVENOX) injection  30 mg Subcutaneous Q24H  . feeding supplement  1 Container Oral BID BM  . mupirocin cream   Topical BID  . pantoprazole  40 mg Oral BID  . polyethylene glycol  17 g Oral BID  . senna-docusate  1 tablet Oral BID  . sodium chloride flush  10-40 mL Intracatheter Q12H   Continuous Infusions:   LOS: 16 days    Time spent: 35 minutes    Gergory Biello A Lilyannah Zuelke, MD [facility]   If 7PM-7AM, please contact night-coverage [email or web address]  08/13/2020, 4:33 PM

## 2020-08-14 LAB — HEPATIC FUNCTION PANEL
ALT: 40 U/L (ref 0–44)
AST: 21 U/L (ref 15–41)
Albumin: 3 g/dL — ABNORMAL LOW (ref 3.5–5.0)
Alkaline Phosphatase: 133 U/L — ABNORMAL HIGH (ref 38–126)
Bilirubin, Direct: 1 mg/dL — ABNORMAL HIGH (ref 0.0–0.2)
Indirect Bilirubin: 1.3 mg/dL — ABNORMAL HIGH (ref 0.3–0.9)
Total Bilirubin: 2.3 mg/dL — ABNORMAL HIGH (ref 0.3–1.2)
Total Protein: 6.5 g/dL (ref 6.5–8.1)

## 2020-08-14 LAB — RENAL FUNCTION PANEL
Albumin: 3 g/dL — ABNORMAL LOW (ref 3.5–5.0)
Anion gap: 12 (ref 5–15)
BUN: 34 mg/dL — ABNORMAL HIGH (ref 6–20)
CO2: 27 mmol/L (ref 22–32)
Calcium: 8.8 mg/dL — ABNORMAL LOW (ref 8.9–10.3)
Chloride: 91 mmol/L — ABNORMAL LOW (ref 98–111)
Creatinine, Ser: 6.83 mg/dL — ABNORMAL HIGH (ref 0.61–1.24)
GFR calc Af Amer: 11 mL/min — ABNORMAL LOW (ref >60)
GFR calc non Af Amer: 9 mL/min — ABNORMAL LOW (ref >60)
Glucose, Bld: 94 mg/dL (ref 70–99)
Phosphorus: 5.1 mg/dL — ABNORMAL HIGH (ref 2.5–4.6)
Potassium: 3.5 mmol/L (ref 3.5–5.1)
Sodium: 130 mmol/L — ABNORMAL LOW (ref 135–145)

## 2020-08-14 LAB — PROTIME-INR
INR: 1.1 (ref 0.8–1.2)
Prothrombin Time: 13.3 seconds (ref 11.4–15.2)

## 2020-08-14 MED ORDER — POLYETHYLENE GLYCOL 3350 17 G PO PACK: 17.0000 g | PACK | Freq: Two times a day (BID) | ORAL | 0 refills | Status: AC

## 2020-08-14 MED ORDER — CALCIUM CARBONATE ANTACID 500 MG PO CHEW: 1.0000 | CHEWABLE_TABLET | Freq: Two times a day (BID) | ORAL | 0 refills | Status: AC | PRN

## 2020-08-14 MED ORDER — AMLODIPINE BESYLATE 5 MG PO TABS: 5.0000 mg | ORAL_TABLET | Freq: Every day | ORAL | 0 refills | Status: AC

## 2020-08-14 MED ORDER — PANTOPRAZOLE SODIUM 40 MG PO TBEC: 40.0000 mg | DELAYED_RELEASE_TABLET | Freq: Two times a day (BID) | ORAL | 1 refills | Status: AC

## 2020-08-14 MED ORDER — AMOXICILLIN 500 MG PO CAPS: 500.0000 mg | ORAL_CAPSULE | Freq: Two times a day (BID) | ORAL | 0 refills | Status: AC

## 2020-08-14 MED ORDER — CLARITHROMYCIN 250 MG PO TABS: 250.0000 mg | ORAL_TABLET | Freq: Two times a day (BID) | ORAL | 0 refills | Status: AC

## 2020-08-14 NOTE — Progress Notes (Signed)
DISCHARGE NOTE HOME Paul Reese to be discharged to home per MD order. Discussed prescriptions and follow up appointments with the patient. Prescriptions given to patient; medication list explained in detail. Patient verbalized understanding.  Skin clean, dry and intact without evidence of skin break down, no evidence of skin tears noted. IV catheter discontinued intact. Site without signs and symptoms of complications. Dressing and pressure applied. Pt denies pain at the site currently. No complaints noted.  Patient free of lines, drains, and wounds.   An After Visit Summary (AVS) was printed and given to the patient. Patient escorted via wheelchair, and discharged home via private auto.  Joslin, Kem Kays, RN

## 2020-08-14 NOTE — Plan of Care (Signed)
  Problem: Pain Managment: Goal: General experience of comfort will improve Outcome: Progressing   

## 2020-08-14 NOTE — Discharge Summary (Signed)
Physician Discharge Summary  Paul Reese ZSW:[id number] DOB: April 17, 1981 DOA: 07/28/2020  PCP: [name], MD  Admit date: 07/28/2020 Discharge date: 08/14/2020  Admitted From: Home  Disposition: Home   Recommendations for Outpatient Follow-up:  1. Follow up with PCP in 1-2 weeks 2. Please obtain BMP/CBC in one week 3. Needs B-met in 1 week.  4. Continue to encourage alcohol cessation.   Home Health: none  Discharge Condition: Stable.  CODE STATUS: Full code Diet recommendation: Heart Healthy   Brief/Interim Summary: 39 year old with past medical history significant for alcohol and polysubstance abuse presented to the ED with unintentional Tylenol overdose.  In addition he was also drinking 6-12 beers a day. -Presented to the ED on 8/8 with abdominal pain, cramps and discomfort.  Emergency room was noted to have abnormal liver function test with AST and ALT greater than that 10,000, bilirubin 5.6, creatinine was 2.2, INR 2.5. -Patient admitted with acute liver injury from Tylenol toxicity and ATN.  Treated with an acetylcysteine per poison control protocol in the ICU, [facility] transplant team was consulted on 8/9, not felt to be appropriate candidate for transplant/ transferred.  Liver function tests improving slowly, had progressive worsening kidney function, treated with CRRT in the ICU then transition to intermittent hemodialysis Now urine output improving, hopeful for renal recovery last hemodialysis 8/19.    1-Acute liver injury, unintentional Tylenol overdose, alcoholic liver disease -Acute hepatitis panel was negative -Declined by [facility] transplant team -Treated with NAC -Liver function tests bilirubin continue to improve. Stable. Follow up with GI in 2 weeks.  -Alcohol cessation counseled.   2-Acute kidney injury/ATN Secondary to acute liver failure, Tylenol toxicity. -Started on CRRT in the ICU then intermittent hemodialysis -Last hemodialysis was 8/19. -Monitoring  for renal recovery.  Creatinine down to 6 from 10. -Clear for discharge. HD catheter to be remove.   3-Nausea, vomiting abdominal pain Gastritis /esophageal ulcer, positive H. Pylori -Endoscopy 8/17 with above findings for to be consistent with healing Mallory-Weiss tear -Continue with PPI -Started clarithromycin and amoxicillin for H. pylori on 8/20 for 14 days.  -Needs to follow-up with GI as an outpatient  4-Acute hypoxic respiratory failure: Resolved  5-Acute metabolic encephalopathy.  related to uremia. Resolved  6-Hypomagnesemia; Corrected.   Normocytic anemia: Stable  Substance use disorder history of EtOH and cocaine use.  Counseling provided Hypokalemia: Resolved.  Hyponatremia:Continue to improved   Discharge Diagnoses:  Active Problems:   Liver failure (HCC)   Tylenol overdose   AKI (acute kidney injury) ([place])   Colitis   Elevated INR   ETOH abuse    Discharge Instructions  Discharge Instructions    Diet - low sodium heart healthy   Complete by: As directed    Increase activity slowly   Complete by: As directed    No wound care   Complete by: As directed      Allergies as of 08/14/2020      Reactions   Ativan [lorazepam] Anxiety, Other (See Comments)   Agitation, Confusion, Auditory and Visual hallucinations   Bee Venom       Medication List    STOP taking these medications   acetaminophen 325 MG tablet Commonly known as: TYLENOL     TAKE these medications   amLODipine 5 MG tablet Commonly known as: NORVASC Take 1 tablet (5 mg total) by mouth daily.   amoxicillin 500 MG capsule Commonly known as: AMOXIL Take 1 capsule (500 mg total) by mouth every 12 (twelve) hours for 8 days.  calcium carbonate 500 MG chewable tablet Commonly known as: TUMS - dosed in mg elemental calcium Chew 1 tablet (200 mg of elemental calcium total) by mouth 3 times/day as needed-between meals & bedtime for indigestion or heartburn.   clarithromycin 250  MG tablet Commonly known as: BIAXIN Take 1 tablet (250 mg total) by mouth every 12 (twelve) hours for 8 days.   pantoprazole 40 MG tablet Commonly known as: PROTONIX Take 1 tablet (40 mg total) by mouth 2 (two) times daily.   polyethylene glycol 17 g packet Commonly known as: MIRALAX / GLYCOLAX Take 17 g by mouth 2 (two) times daily.       Follow-up Information    Mansouraty, Telford Nab., MD Follow up in 2 week(s).   Specialties: Gastroenterology, Internal Medicine Contact information: [place] [address] [phone]        Rosita Fire, MD Follow up in 2 week(s).   Specialties: Nephrology, Internal Medicine Contact information: [address]  [place] [address] [phone]              Allergies  Allergen Reactions  . Ativan [Lorazepam] Anxiety and Other (See Comments)    Agitation, Confusion, Auditory and Visual hallucinations  . Bee Venom     Consultations:  Nephrology  GI   Procedures/Studies: CT ABDOMEN PELVIS WO CONTRAST  Result Date: 08/09/2020 CLINICAL DATA:  Abdominal pain with nausea and vomiting. EXAM: CT ABDOMEN AND PELVIS WITHOUT CONTRAST TECHNIQUE: Multidetector CT imaging of the abdomen and pelvis was performed following the standard protocol without IV contrast. COMPARISON:  July 28, 2020 FINDINGS: Lower chest: Marked severity atelectasis and/or infiltrate is seen within the bilateral lung bases. Small bilateral pleural effusions are noted, right greater than left. Hepatobiliary: No focal liver abnormality is seen. No gallstones, gallbladder wall thickening, or biliary dilatation. Pancreas: Unremarkable. No pancreatic ductal dilatation or surrounding inflammatory changes. Spleen: Normal in size without focal abnormality. Adrenals/Urinary Tract: Adrenal glands are unremarkable. Kidneys are normal in size without focal lesions. A 4 mm nonobstructing renal stone is seen within the mid right kidney. 2 mm and 3 mm  nonobstructing renal stones are seen within the left kidney. Bladder is unremarkable. Stomach/Bowel: Stomach is within normal limits. Appendix appears normal. No evidence of bowel wall thickening, distention, or inflammatory changes. Vascular/Lymphatic: No significant vascular findings are present. No enlarged abdominal or pelvic lymph nodes. Reproductive: Prostate is unremarkable. Other: A marked amount of edema is seen along the posterior wall of the lower abdomen and pelvis. There is a small amount of abdominal and pelvic free fluid. Musculoskeletal: No acute or significant osseous findings. IMPRESSION: 1. Marked severity bibasilar atelectasis and/or infiltrate. 2. Small bilateral pleural effusions, right greater than left. 3. Bilateral subcentimeter non-obstructing renal stones. 4. Small amount of abdominal and pelvic free fluid. Electronically Signed   By: Virgina Norfolk M.D.   On: 08/09/2020 15:38   DG Chest 1 View  Result Date: 07/31/2020 CLINICAL DATA:  Dialysis catheter placement, lethargy, smoker EXAM: CHEST  1 VIEW COMPARISON:  Portable exam 1100 hours without priors for comparison FINDINGS: RIGHT jugular line with tip projecting over proximal SVC. Normal heart size, mediastinal contours, and pulmonary vascularity. BILATERAL perihilar to basilar infiltrates question pulmonary edema, infection not excluded. Small LEFT pleural effusion. Pneumothorax or acute osseous findings. IMPRESSION: BILATERAL pulmonary infiltrates favoring pulmonary edema with small LEFT pleural effusion. No pneumothorax following RIGHT jugular line placement. Electronically Signed   By: Lavonia Dana M.D.   On: 07/31/2020 11:27  DG Chest 2 View  Result Date: 08/08/2020 CLINICAL DATA:  39 year old male with shortness of breath. EXAM: CHEST - 2 VIEW COMPARISON:  Chest radiograph dated 08/02/2020. FINDINGS: Right IJ central venous line with tip over central SVC in similar position. There is mild cardiomegaly with vascular  congestion and edema and small bilateral pleural effusions. Pneumonia is not excluded. Clinical correlation is recommended. Overall overall slight progression of the edema compared to the prior radiograph. No pneumothorax. No acute osseous pathology. IMPRESSION: Cardiomegaly with findings of CHF and small bilateral pleural effusions, slightly progressed since the prior radiograph. Electronically Signed   By: Anner Crete M.D.   On: 08/08/2020 22:41   US RENAL  Result Date: 07/29/2020 CLINICAL DATA:  39 year old male with acute renal insufficiency. EXAM: RENAL / URINARY TRACT ULTRASOUND COMPLETE COMPARISON:  CT abdomen pelvis dated 07/28/2020. FINDINGS: Right Kidney: Renal measurements: 12.4 x 4.5 x 5.5 cm = volume: 155 mL. There is diffuse increased renal parenchymal echogenicity. No hydronephrosis or shadowing stone. Trace perinephric free fluid. Left Kidney: Renal measurements: 11.2 x 5.3 x 5.9 cm = volume: 183 mL. Diffuse increased renal parenchyma echogenicity. No hydronephrosis or shadowing stone. Trace perinephric fluid. Bladder: The urinary bladder is collapsed and not visualized. Other: There is diffuse increased liver echogenicity most commonly seen in the setting of fatty infiltration. Superimposed inflammation or fibrosis is not excluded. Clinical correlation is recommended. IMPRESSION: 1. Echogenic kidneys likely related to underlying medical renal disease. No hydronephrosis or shadowing stone. 2. Fatty liver. Electronically Signed   By: Anner Crete M.D.   On: 07/29/2020 19:19   DG CHEST PORT 1 VIEW  Result Date: 08/02/2020 CLINICAL DATA:  Follow-up of a bilateral pulmonary infiltrates. EXAM: PORTABLE CHEST 1 VIEW COMPARISON:  Chest radiograph dated 07/31/2020. FINDINGS: The heart size and mediastinal contours are within normal limits. Bilateral mid and lower lung interstitial and airspace opacities appear unchanged. A small left pleural effusion is unchanged. There is no right pleural  effusion. There is no pneumothorax. The visualized skeletal structures are unremarkable. A right internal jugular vascular sheath tip overlies the superior vena cava. IMPRESSION: Unchanged bilateral mid and lower lung interstitial and airspace opacities. Unchanged left pleural effusion. Electronically Signed   By: Zerita Boers M.D.   On: 08/02/2020 11:42   CT RENAL STONE STUDY  Result Date: 07/28/2020 CLINICAL DATA:  Left-sided flank, left lower quadrant pain and nausea 2 days, history of renal stones, dark urine EXAM: CT ABDOMEN AND PELVIS WITHOUT CONTRAST TECHNIQUE: Multidetector CT imaging of the abdomen and pelvis was performed following the standard protocol without IV contrast. COMPARISON:  02/15/2007 FINDINGS: Lower chest: No acute abnormality. Hepatobiliary: No solid liver abnormality is seen. Hepatic steatosis. No gallstones, gallbladder wall thickening, or biliary dilatation. Pancreas: Unremarkable. No pancreatic ductal dilatation or surrounding inflammatory changes. Spleen: Normal in size without significant abnormality. Adrenals/Urinary Tract: Adrenal glands are unremarkable. Multiple small bilateral renal calculi. No hydronephrosis. Bladder is unremarkable. Stomach/Bowel: Stomach is within normal limits. Appendix appears normal. There is some suggestion of colonic wall thickening, particularly the transverse colon (series 5, image 28). Vascular/Lymphatic: No significant vascular findings are present. No enlarged abdominal or pelvic lymph nodes. Reproductive: No mass or other significant abnormality. Other: No abdominal wall hernia or abnormality. Small volume free fluid in the low pelvis. Musculoskeletal: No acute or significant osseous findings. IMPRESSION: 1. There is some suggestion of colonic wall thickening, particularly the transverse colon, suggestive of nonspecific infectious or inflammatory colitis. Correlate with referable signs and symptoms. 2. Small  volume nonspecific free fluid in the  low pelvis, likely reactive. 3. Multiple small bilateral renal calculi without evidence of ureteral calculus or hydronephrosis. 4. Hepatic steatosis. Electronically Signed   By: Eddie Candle M.D.   On: 07/28/2020 14:55   [place] ASCITES (ABDOMEN LIMITED)  Result Date: 08/03/2020 CLINICAL DATA:  Evaluate for ascites. EXAM: LIMITED ABDOMEN ULTRASOUND FOR ASCITES TECHNIQUE: Limited ultrasound survey for ascites was performed in all four abdominal quadrants. COMPARISON:  Right upper quadrant ultrasound 07/28/2020 FINDINGS: Minimal ascites in the right lower quadrant. Stable changes of small amount of pericholecystic fluid versus edema of the gallbladder wall as the wall in fluid together measure 1 cm unchanged. No evidence of cholelithiasis. Right pleural effusion. IMPRESSION: 1.  Minimal ascites right lower quadrant. 2. Stable nonspecific edematous gallbladder wall versus pericholecystic fluid. Electronically Signed   By: Marin Olp M.D.   On: 08/03/2020 17:13   US LIVER DOPPLER  Result Date: 07/29/2020 CLINICAL DATA:  Acute liver failure and acetaminophen overdose. EXAM: DUPLEX ULTRASOUND OF LIVER TECHNIQUE: Color and duplex Doppler ultrasound was performed to evaluate the hepatic in-flow and out-flow vessels. COMPARISON:  None. FINDINGS: Portal Vein Velocities Main:  62 cm/sec Right:  20 cm/sec Left:  40 cm/sec Hepatic Vein Velocities Right:  56 cm/sec Middle:  60 cm/sec Left:  67 cm/sec Hepatic Artery Velocity:  132 cm/sec Splenic Vein Velocity:  65 cm/sec Varices: None visualized. Ascites: None visualized. Portal vein flow is towards the liver. No evidence of portal vein thrombus. Normal portal vein waveforms. Hepatic vein waveforms are normal. No evidence of hepatic veno-occlusive disease. The spleen is normal in size. Intrahepatic IVC is patent. IMPRESSION: Unremarkable hepatic duplex ultrasound. Electronically Signed   By: Aletta Edouard M.D.   On: 07/29/2020 13:39   US Abdomen Limited RUQ  Result  Date: 07/28/2020 CLINICAL DATA:  Elevated liver function tests, abdominal pain EXAM: ULTRASOUND ABDOMEN LIMITED RIGHT UPPER QUADRANT COMPARISON:  None. FINDINGS: Gallbladder: The gallbladder is decompressed. No intraluminal stones or sludge is identified. The gallbladder wall, however, appears slightly thickened and edematous, particularly involving its hepatic segment. This is nonspecific, but can be seen in the setting of inflammatory conditions, such as cholangitis or hepatitis, or in the setting of hypoproteinemia/anasarca, though this is considered less likely given the lack of associated ascites. Common bile duct: Diameter: 5 mm in proximal diameter. The distal duct is obscured by overlying bowel gas. Liver: No focal lesion identified. Within normal limits in parenchymal echogenicity. Portal vein is patent on color Doppler imaging with normal direction of blood flow towards the liver. Other: No ascites is identified IMPRESSION: Edematous change of the gallbladder wall. This can be seen in the setting of underlying inflammation, as can be seen with hepatitis or cholangitis. Correlation with laboratory examination may be helpful for further evaluation. Electronically Signed   By: Fidela Salisbury MD   On: 07/28/2020 16:48    Subjective: Feeling well  Discharge Exam: Vitals:   08/14/20 0527 08/14/20 0904  BP: (!) 132/97 (!) 126/92  Pulse: 81 83  Resp: 18 18  Temp: (!) 97.3 F (36.3 C) 99 F (37.2 C)  SpO2: 96% 98%     General: Pt is alert, awake, not in acute distress Cardiovascular: RRR, S1/S2 +, no rubs, no gallops Respiratory: CTA bilaterally, no wheezing, no rhonchi Abdominal: Soft, NT, ND, bowel sounds + Extremities: no edema, no cyanosis    The results of significant diagnostics from this hospitalization (including imaging, microbiology, ancillary and laboratory) are listed below for  reference.     Microbiology: No results found for this or any previous visit (from the past 240  hour(s)).   Labs: BNP (last 3 results) Recent Labs    08/09/20 1515  BNP 0,223.3*   Basic Metabolic Panel: Recent Labs  Lab 08/10/20 0450 08/11/20 0340 08/12/20 0458 08/13/20 0459 08/14/20 0435  NA 125* 126* 128* 129* 130*  K 4.0 4.0 3.5 3.3* 3.5  CL 89* 89* 88* 89* 91*  CO2 _0 GLUCOSE 144* 97 98 135* 94  BUN 31* 34* 37* 36* 34*  CREATININE 10.23* 10.82* 10.76* 8.94* 6.83*  CALCIUM 7.9* 8.1* 8.5* 8.5* 8.8*  PHOS 6.0* 6.5* 6.7* 6.1* 5.1*   Liver Function Tests: Recent Labs  Lab 08/09/20 0346 08/09/20 0346 08/10/20 0450 08/11/20 0340 08/12/20 0458 08/13/20 0459 08/14/20 0435  AST 28  --  20 20  --  20 21  ALT 137*  --  96* 80*  --  46* 40  ALKPHOS 151*  --  140* 142*  --  137* 133*  BILITOT 3.2*  --  2.6* 2.7*  --  2.1* 2.3*  PROT 5.6*  --  5.7* 6.0*  --  6.1* 6.5  ALBUMIN 2.2*  2.2*   < > 2.1*  2.1* 2.3*  2.2* 2.8* 2.8*  2.7* 3.0*  3.0*   < > = values in this interval not displayed.   No results for input(s): LIPASE, AMYLASE in the last 168 hours. Recent Labs  Lab 08/08/20 0350  AMMONIA 28   CBC: Recent Labs  Lab 08/08/20 0350 08/09/20 0346 08/10/20 0450 08/11/20 0340  WBC 9.2 9.8 9.4 10.8*  HGB 9.1* 8.5* 8.4* 8.8*  HCT 26.9* 25.5* 24.9* 25.8*  MCV 96.1 96.6 95.8 94.2  PLT 249 262 287 334   Cardiac Enzymes: No results for input(s): CKTOTAL, CKMB, CKMBINDEX, TROPONINI in the last 168 hours. BNP: Invalid input(s): POCBNP CBG: No results for input(s): GLUCAP in the last 168 hours. D-Dimer No results for input(s): DDIMER in the last 72 hours. Hgb A1c No results for input(s): HGBA1C in the last 72 hours. Lipid Profile No results for input(s): CHOL, HDL, LDLCALC, TRIG, CHOLHDL, LDLDIRECT in the last 72 hours. Thyroid function studies No results for input(s): TSH, T4TOTAL, T3FREE, THYROIDAB in the last 72 hours.  Invalid input(s): FREET3 Anemia work up No results for input(s): VITAMINB12, FOLATE, FERRITIN, TIBC, IRON,  RETICCTPCT in the last 72 hours. Urinalysis    Component Value Date/Time   COLORURINE AMBER (A) 07/28/2020 1310   APPEARANCEUR CLOUDY (A) 07/28/2020 1310   LABSPEC 1.022 07/28/2020 1310   PHURINE 5.0 07/28/2020 1310   GLUCOSEU NEGATIVE 07/28/2020 1310   HGBUR MODERATE (A) 07/28/2020 1310   BILIRUBINUR NEGATIVE 07/28/2020 1310   KETONESUR NEGATIVE 07/28/2020 1310   PROTEINUR >=300 (A) 07/28/2020 1310   NITRITE NEGATIVE 07/28/2020 1310   LEUKOCYTESUR TRACE (A) 07/28/2020 1310   Sepsis Labs Invalid input(s): PROCALCITONIN,  WBC,  LACTICIDVEN Microbiology No results found for this or any previous visit (from the past 240 hour(s)).   Time coordinating discharge: 40 minutes  SIGNED:   Elmarie Shiley, MD  [facility]

## 2020-08-14 NOTE — Progress Notes (Signed)
**Paul Paul** Paul Paul  Assessment/ Plan: Pt is a 39 y.o. yo male with history of alcohol and polysubstance abuse who was admitted with unintentional Tylenol overdose causing acute liver failure and acute kidney injury required dialysis.  #Severe acute kidney injury, dialysis dependent: Due to acute liver failure in the setting of Tylenol overdose.  Presumed normal baseline creatinine level.  Kidney ultrasound with echogenic kidneys without hydronephrosis.  Last dialysis was on 8/19. He has great urine output and creatinine level continue to improve.  Clinically asymptomatic, euvolemic.  There is no need for dialysis therefore we will discontinue HD catheter. We will arrange outpatient follow-up, our office will call him with appointment.  Recommend to check renal panel with PCP in a week.  #Tylenol overdose/acute liver failure: Treated medically.  GI and primary team managed.  Improving.  #Hyponatremia, hypervolemic: Due to reduced free water excretion.  Continue fluid restriction.  The edema has improved.  No need for diuretics.  # Anemia, normocytic: With some GI loss, renal failure: Received iron, and start ESA on 8/23.  Monitor hemoglobin.  #Hypertension:Continue current antihypertensive medication.    Subjective: Seen and examined at bedside.  Urine output 3.8 L.  Eager to go home today.  Denies nausea vomiting chest pain shortness of breath.  Eating good.  Objective Vital signs in last 24 hours: Vitals:   08/13/20 1700 08/13/20 2224 08/14/20 0527 08/14/20 0904  BP: 130/89 135/89 (!) 132/97 (!) 126/92  Pulse: 92 85 81 83  Resp: 18 18 18 18   Temp: 98.7 F (37.1 C) 99.1 F (37.3 C) (!) 97.3 F (36.3 C) 99 F (37.2 C)  TempSrc: Oral Oral Oral Oral  SpO2: 97% 100% 96% 98%  Weight:      Height:       Weight change:   Intake/Output Summary (Last 24 hours) at 08/14/2020 1120 Last data filed at 08/14/2020 0900 Gross per 24 hour  Intake 880 ml   Output 2950 ml  Net -2070 ml       Labs: Basic Metabolic Panel: Recent Labs  Lab 08/12/20 0458 08/13/20 0459 08/14/20 0435  NA 128* 129* 130*  K 3.5 3.3* 3.5  CL 88* 89* 91*  CO2 27 29 27   GLUCOSE 98 135* 94  BUN 37* 36* 34*  CREATININE 10.76* 8.94* 6.83*  CALCIUM 8.5* 8.5* 8.8*  PHOS 6.7* 6.1* 5.1*   Liver Function Tests: Recent Labs  Lab 08/11/20 0340 08/11/20 0340 08/12/20 0458 08/13/20 0459 08/14/20 0435  AST 20  --   --  20 21  ALT 80*  --   --  46* 40  ALKPHOS 142*  --   --  137* 133*  BILITOT 2.7*  --   --  2.1* 2.3*  PROT 6.0*  --   --  6.1* 6.5  ALBUMIN 2.3*  2.2*   < > 2.8* 2.8*  2.7* 3.0*  3.0*   < > = values in this interval not displayed.   No results for input(s): LIPASE, AMYLASE in the last 168 hours. Recent Labs  Lab 08/08/20 0350  AMMONIA 28   CBC: Recent Labs  Lab 08/08/20 0350 08/08/20 0350 08/09/20 0346 08/10/20 0450 08/11/20 0340  WBC 9.2   < > 9.8 9.4 10.8*  HGB 9.1*   < > 8.5* 8.4* 8.8*  HCT 26.9*   < > 25.5* 24.9* 25.8*  MCV 96.1  --  96.6 95.8 94.2  PLT 249   < > 262 287 334   < > =  values in this interval not displayed.   Cardiac Enzymes: No results for input(s): CKTOTAL, CKMB, CKMBINDEX, TROPONINI in the last 168 hours. CBG: No results for input(s): GLUCAP in the last 168 hours.  Iron Studies: No results for input(s): IRON, TIBC, TRANSFERRIN, FERRITIN in the last 72 hours. Studies/Results: No results found.  Medications: Infusions:   Scheduled Medications: . amLODipine  5 mg Oral Daily  . amoxicillin  500 mg Oral Q12H  . Chlorhexidine Gluconate Cloth  6 each Topical Q0600  . clarithromycin  250 mg Oral Q12H  . darbepoetin (ARANESP) injection - NON-DIALYSIS  60 mcg Subcutaneous Q Mon-1800  . enoxaparin (LOVENOX) injection  30 mg Subcutaneous Q24H  . feeding supplement  1 Container Oral BID BM  . mupirocin cream   Topical BID  . pantoprazole  40 mg Oral BID  . polyethylene glycol  17 g Oral BID  .  senna-docusate  1 tablet Oral BID  . sodium chloride flush  10-40 mL Intracatheter Q12H    have reviewed scheduled and prn medications.  Physical Exam: General:NAD, comfortable, able to lie on bed Heart:RRR, s1s2 nl, no rubs Lungs:clear b/l, no crackle Abdomen:soft, Non-tender, non-distended Extremities: No LE edema. Dialysis Access: Has temporary catheter which will be removed today.  Sherard Sutch Prasad Kamerin Axford 08/14/2020,11:20 AM  LOS: 17 days  Pager: [phone]

## 2020-08-14 NOTE — TOC Transition Note (Signed)
Transition of Care [facility]) - CM/SW Discharge Note   Patient Details  Name: Paul Reese MRN: [id number] Date of Birth: 18-Jul-1981  [facility] CM/SW Contact:  Bess Kinds, RN Phone Number: [phone] 08/14/2020, 3:04 PM   Clinical Narrative:     Spoke with patient at the bedside. Patient very glad to be transitioning home today. States that he recently moved 2 months ago from [facility] to [place]. States that his PCP is in [place], and he will follow up. Discussed needing to have a [facility]Met lab draw. Patient produced a hand written list of everything he needed to do for follow up. No furhter TOC needs identified.   Final next level of care: Home/Self Care Barriers to Discharge: No Barriers Identified   Patient Goals and CMS Choice Patient states their goals for this hospitalization and ongoing recovery are:: return home CMS Medicare.gov Compare Post Acute Care list provided to:: Patient Choice offered to / list presented to : NA  Discharge Placement                       Discharge Plan and Services   Discharge Planning Services: CM Consult            DME Arranged: N/A DME Agency: NA       HH Arranged: NA HH Agency: NA        Social Determinants of Health (SDOH) Interventions     Readmission Risk Interventions No flowsheet data found.

## 2020-08-14 NOTE — Progress Notes (Signed)
PROGRESS NOTE    Paul Reese  EKC:[id number] DOB: Apr 04, 1981 DOA: 07/28/2020 PCP: Pennie Banter, MD   Brief Narrative: 39 year old with past medical history significant for alcohol and polysubstance abuse presented to the ED with unintentional Tylenol overdose.  In addition he was also drinking 6-12 beers a day. -Presented to the ED on 8/8 with abdominal pain, cramps and discomfort.  Emergency room was noted to have abnormal liver function test with AST and ALT greater than that 10,000, bilirubin 5.6, creatinine was 2.2, INR 2.5. -Patient admitted with acute liver injury from Tylenol toxicity and ATN.  Treated with an acetylcysteine per poison control protocol in the ICU, [facility] transplant team was consulted on 8/9, not felt to be appropriate candidate for transplant/ transferred.  Liver function tests improving slowly, had progressive worsening kidney function, treated with CRRT in the ICU then transition to intermittent hemodialysis Now urine output improving, hopeful for renal recovery last hemodialysis 8/19.    Assessment & Plan:   Active Problems:   Liver failure (HCC)   Tylenol overdose   AKI (acute kidney injury) (HCC)   Colitis   Elevated INR   ETOH abuse   1-Acute liver injury, unintentional Tylenol overdose, alcoholic liver disease -Acute hepatitis panel was negative -Declined by [facility] transplant team -Treated with NAC -Liver function tests bilirubin continue to improve. Stable. Follow up with GI in 2 weeks.  -Alcohol cessation counseled.   2-Acute kidney injury/ATN Secondary to acute liver failure, Tylenol toxicity. -Started on CRRT in the ICU then intermittent hemodialysis -Last hemodialysis was 8/19. -Monitoring for renal recovery.  Creatinine down to 6 from 10. -Nephrology planning to remove HD catheter.   3-Nausea, vomiting abdominal pain Gastritis /esophageal ulcer, positive H. Pylori -Endoscopy 8/17 with above findings for to be consistent with healing  Mallory-Weiss tear -Continue with PPI -Started clarithromycin and amoxicillin for H. pylori on 8/20 for 14 days.  -Needs to follow-up with GI as an outpatient  4-Acute hypoxic respiratory failure: Resolved  5-Acute metabolic encephalopathy.  related to uremia. Resolved  6-Hypomagnesemia; Corrected.   Normocytic anemia: Stable  Substance use disorder history of EtOH and cocaine use.  Counseling provided Hypokalemia: Resolved.  Hyponatremia:Continue to improved  Estimated body mass index is 24.43 kg/m as calculated from the following:   Height as of this encounter: 6\' 1"  (1.854 m).   Weight as of this encounter: 84 kg.   DVT prophylaxis: Lovenox Code Status: Full code Family Communication: care discussed with patient Disposition Plan:  Status is: Inpatient  Remains inpatient appropriate because:Persistent severe electrolyte disturbances   Dispo: The patient is from: Home              Anticipated d/c is to: Home              Anticipated d/c date is: 2 days              Patient currently is not medically stable to d/c.        Consultants:   Nephrology   Procedures:   HD  CCRT    Antimicrobials:    Subjective: He is feeling well, no new complaints.    Objective: Vitals:   08/13/20 1006 08/13/20 1700 08/13/20 2224 08/14/20 0527  BP: 129/82 130/89 135/89 (!) 132/97  Pulse: 80 92 85 81  Resp: 18 18 18 18   Temp: 98.8 F (37.1 C) 98.7 F (37.1 C) 99.1 F (37.3 C) (!) 97.3 F (36.3 C)  TempSrc: Oral Oral Oral Oral  SpO2: 92%  97% 100% 96%  Weight:      Height:        Intake/Output Summary (Last 24 hours) at 08/14/2020 7628 Last data filed at 08/14/2020 0430 Gross per 24 hour  Intake 820 ml  Output 3825 ml  Net -3005 ml   Filed Weights   08/10/20 0500 08/11/20 0424 08/11/20 2110  Weight: 75.7 kg 83.9 kg 84 kg    Examination:  General exam: NAD Respiratory system: CTA Cardiovascular system: S 1, S 2 RRR Gastrointestinal system: BS  present, soft, nt Central nervous system: alert and orienetd Extremities: Symmetric power    Data Reviewed: I have personally reviewed following labs and imaging studies  CBC: Recent Labs  Lab 08/08/20 0350 08/09/20 0346 08/10/20 0450 08/11/20 0340  WBC 9.2 9.8 9.4 10.8*  HGB 9.1* 8.5* 8.4* 8.8*  HCT 26.9* 25.5* 24.9* 25.8*  MCV 96.1 96.6 95.8 94.2  PLT 249 262 287 334   Basic Metabolic Panel: Recent Labs  Lab 08/10/20 0450 08/11/20 0340 08/12/20 0458 08/13/20 0459 08/14/20 0435  NA 125* 126* 128* 129* 130*  K 4.0 4.0 3.5 3.3* 3.5  CL 89* 89* 88* 89* 91*  CO2 25 25 27 29 27   GLUCOSE 144* 97 98 135* 94  BUN 31* 34* 37* 36* 34*  CREATININE 10.23* 10.82* 10.76* 8.94* 6.83*  CALCIUM 7.9* 8.1* 8.5* 8.5* 8.8*  PHOS 6.0* 6.5* 6.7* 6.1* 5.1*   GFR: Estimated Creatinine Clearance: 16.2 mL/min (A) (by C-G formula based on SCr of 6.83 mg/dL (H)). Liver Function Tests: Recent Labs  Lab 08/09/20 0346 08/09/20 0346 08/10/20 0450 08/11/20 0340 08/12/20 0458 08/13/20 0459 08/14/20 0435  AST 28  --  20 20  --  20 21  ALT 137*  --  96* 80*  --  46* 40  ALKPHOS 151*  --  140* 142*  --  137* 133*  BILITOT 3.2*  --  2.6* 2.7*  --  2.1* 2.3*  PROT 5.6*  --  5.7* 6.0*  --  6.1* 6.5  ALBUMIN 2.2*  2.2*   < > 2.1*  2.1* 2.3*  2.2* 2.8* 2.8*  2.7* 3.0*  3.0*   < > = values in this interval not displayed.   No results for input(s): LIPASE, AMYLASE in the last 168 hours. Recent Labs  Lab 08/08/20 0350  AMMONIA 28   Coagulation Profile: Recent Labs  Lab 08/08/20 0350 08/14/20 0435  INR 1.1 1.1   Cardiac Enzymes: No results for input(s): CKTOTAL, CKMB, CKMBINDEX, TROPONINI in the last 168 hours. BNP (last 3 results) No results for input(s): PROBNP in the last 8760 hours. HbA1C: No results for input(s): HGBA1C in the last 72 hours. CBG: No results for input(s): GLUCAP in the last 168 hours. Lipid Profile: No results for input(s): CHOL, HDL, LDLCALC, TRIG,  CHOLHDL, LDLDIRECT in the last 72 hours. Thyroid Function Tests: No results for input(s): TSH, T4TOTAL, FREET4, T3FREE, THYROIDAB in the last 72 hours. Anemia Panel: No results for input(s): VITAMINB12, FOLATE, FERRITIN, TIBC, IRON, RETICCTPCT in the last 72 hours. Sepsis Labs: No results for input(s): PROCALCITON, LATICACIDVEN in the last 168 hours.  No results found for this or any previous visit (from the past 240 hour(s)).       Radiology Studies: No results found.      Scheduled Meds: . amLODipine  5 mg Oral Daily  . amoxicillin  500 mg Oral Q12H  . Chlorhexidine Gluconate Cloth  6 each Topical Q0600  . clarithromycin  250 mg  Oral Q12H  . darbepoetin (ARANESP) injection - NON-DIALYSIS  60 mcg Subcutaneous Q Mon-1800  . enoxaparin (LOVENOX) injection  30 mg Subcutaneous Q24H  . feeding supplement  1 Container Oral BID BM  . mupirocin cream   Topical BID  . pantoprazole  40 mg Oral BID  . polyethylene glycol  17 g Oral BID  . senna-docusate  1 tablet Oral BID  . sodium chloride flush  10-40 mL Intracatheter Q12H   Continuous Infusions:   LOS: 17 days    Time spent: 35 minutes    Paul Purdum A Sherryll Skoczylas, MD [facility]   If 7PM-7AM, please contact night-coverage [email or web address]  08/14/2020, 8:32 AM

## 2020-09-11 ENCOUNTER — Ambulatory Visit: Payer: Self-pay | Admitting: Gastroenterology

## 2020-11-10 IMAGING — US US ABDOMEN LIMITED
1 series · 14 of 25 positions shown · non-contrast
Comparison: None.

CLINICAL DATA: Elevated liver function tests, abdominal pain

EXAM:
ULTRASOUND ABDOMEN LIMITED RIGHT UPPER QUADRANT

[Series 1: us abdomen limited ruq · 14 of 62 slices shown]
[im 1/62]
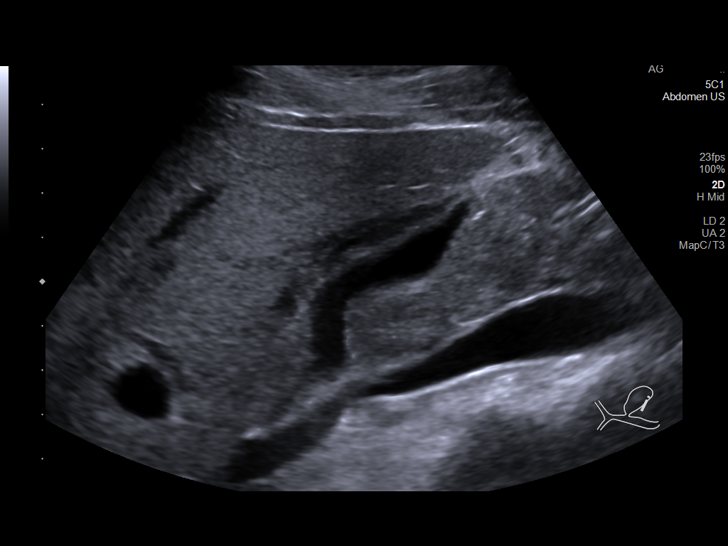
[im 6/62]
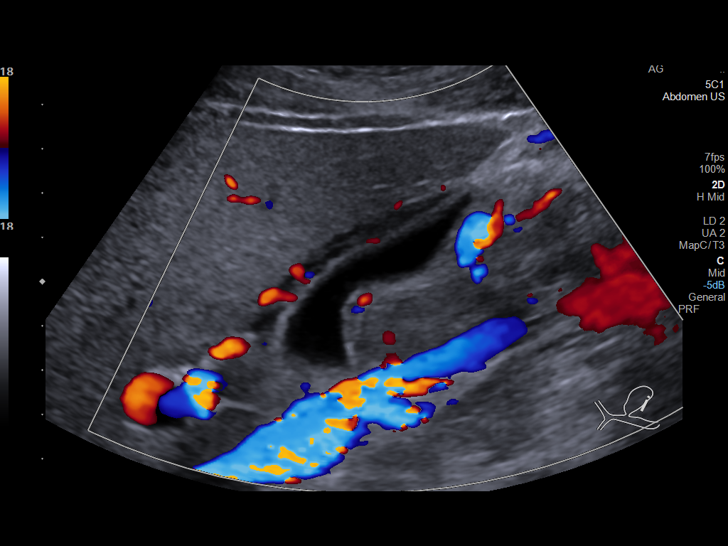
[im 11/62]
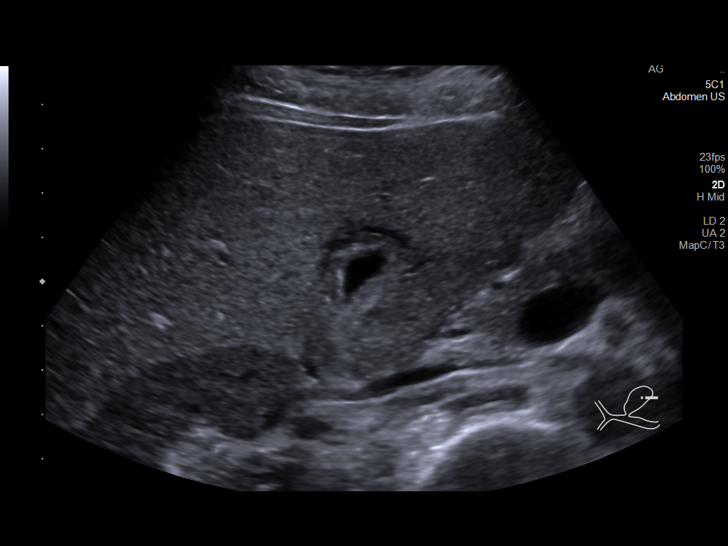
[im 16/62]
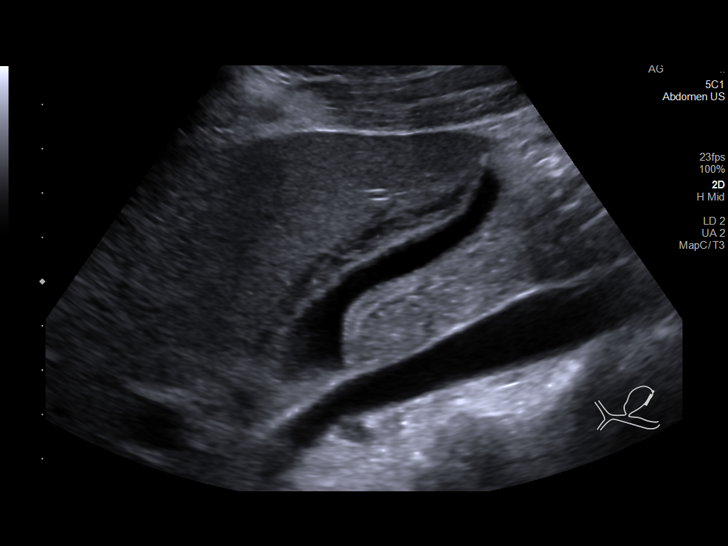
[im 21/62]
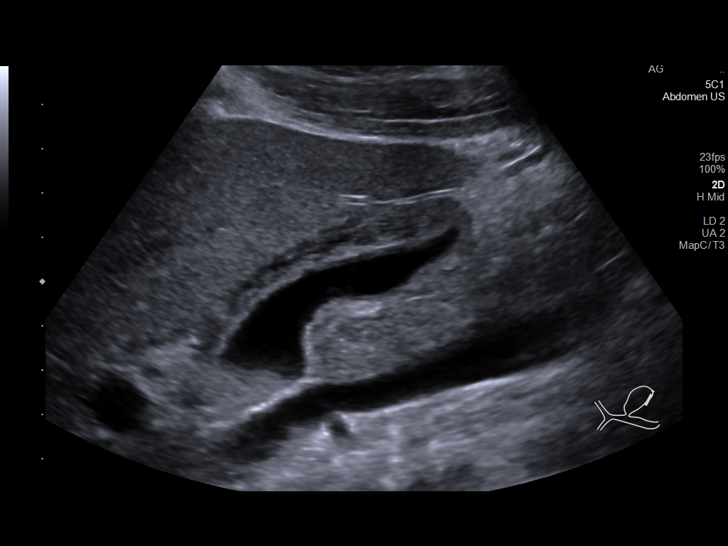
[im 23/62]
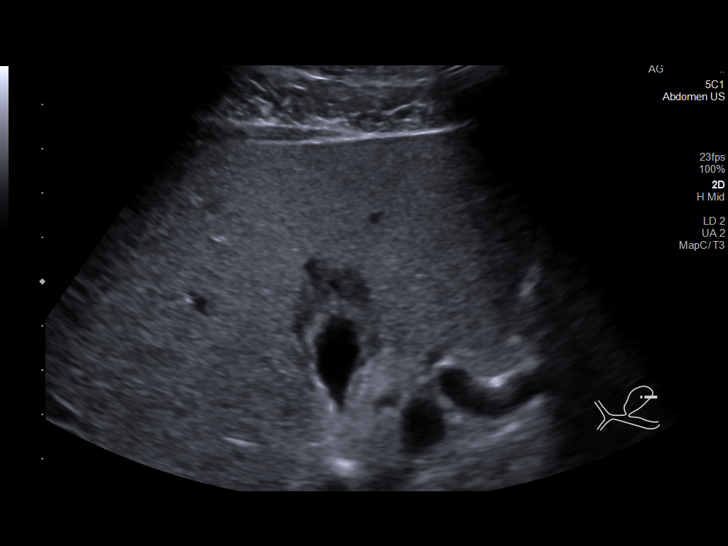
[im 28/62]
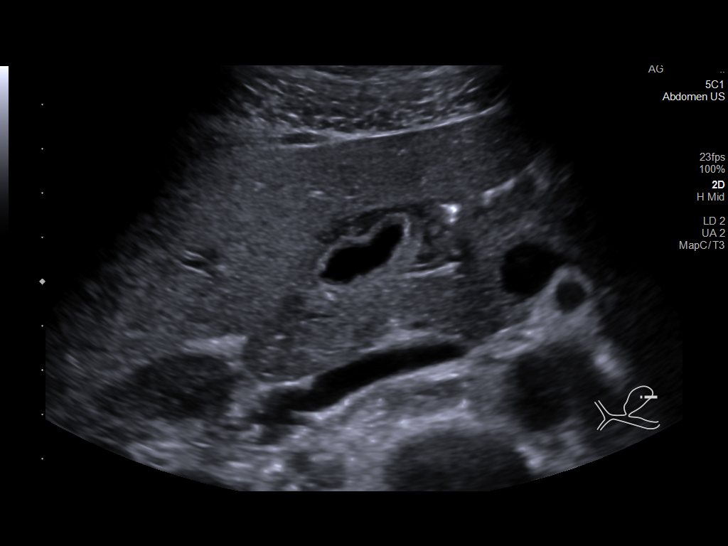
[im 34/62]
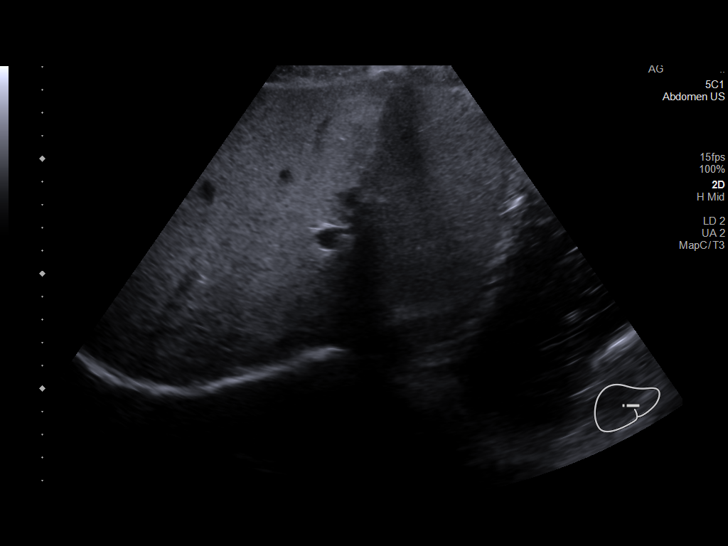
[im 39/62]
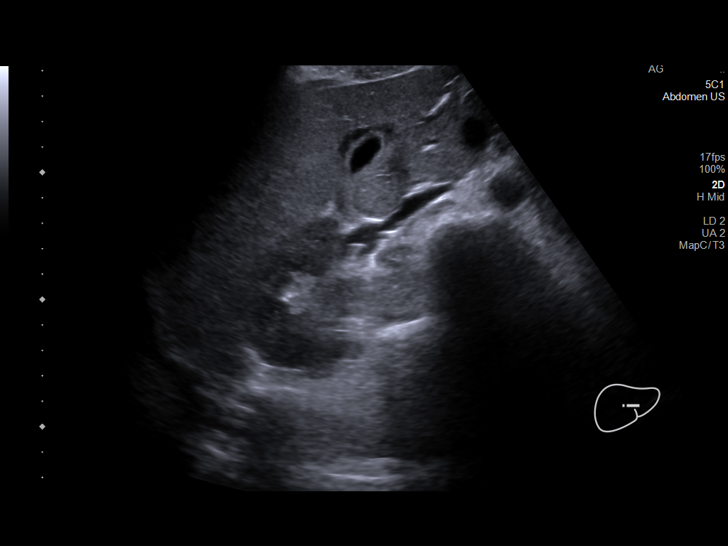
[im 41/62]
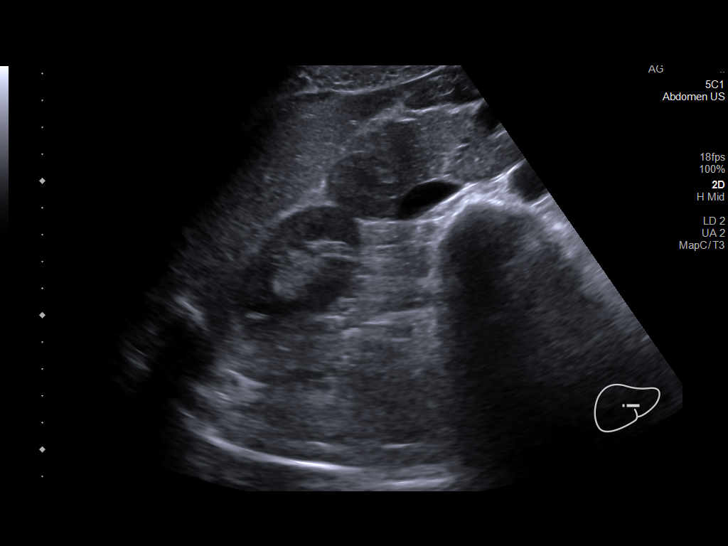
[im 46/62]
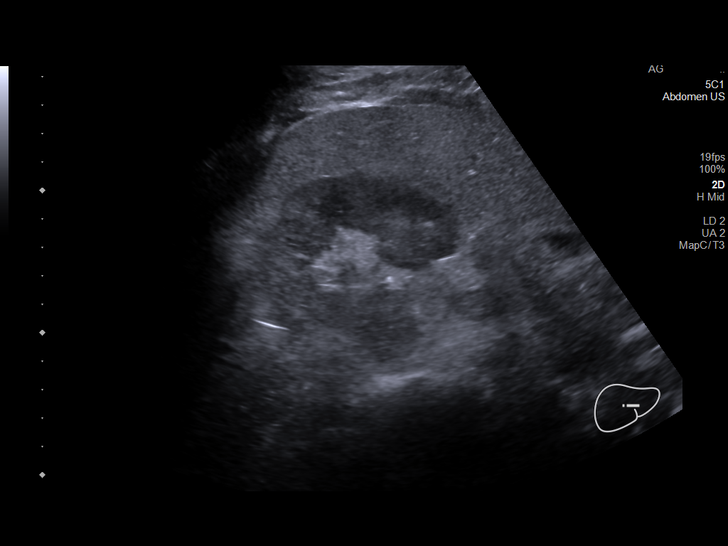
[im 51/62]
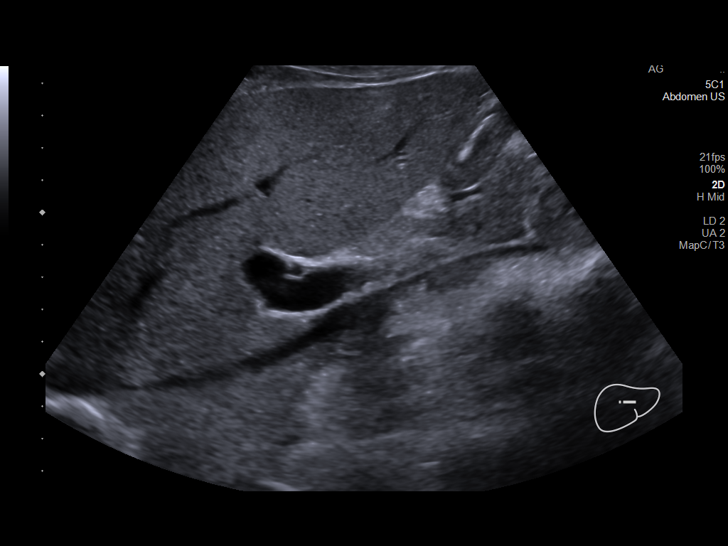
[im 56/62]
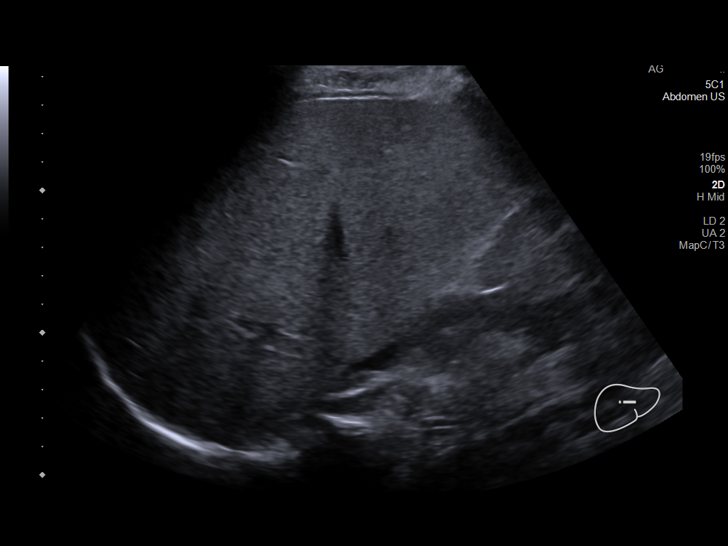
[im 62/62]
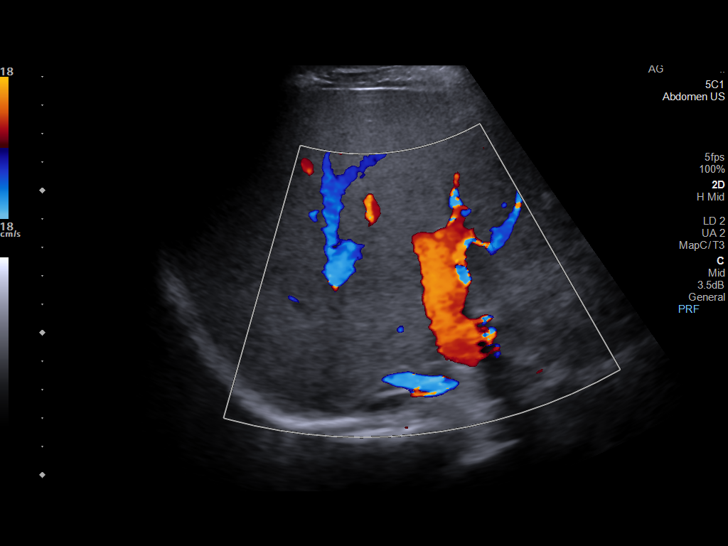

[14 of 25 positions shown; findings below may reference images not displayed]

FINDINGS: Gallbladder:

The gallbladder is decompressed. No intraluminal stones or sludge is
identified. The gallbladder wall, however, appears slightly
thickened and edematous, particularly involving its hepatic segment.
This is nonspecific, but can be seen in the setting of inflammatory
conditions, such as cholangitis or hepatitis, or in the setting of
hypoproteinemia/anasarca, though this is considered less likely
given the lack of associated ascites.

Common bile duct:

Diameter: 5 mm in proximal diameter. The distal duct is obscured by
overlying bowel gas.

Liver:

No focal lesion identified. Within normal limits in parenchymal
echogenicity. Portal vein is patent on color Doppler imaging with
normal direction of blood flow towards the liver.

Other: No ascites is identified
IMPRESSION: Edematous change of the gallbladder wall. This can be seen in the
setting of underlying inflammation, as can be seen with hepatitis or
cholangitis. Correlation with laboratory examination may be helpful
for further evaluation.

## 2020-11-11 IMAGING — US US RENAL
1 series · 14 of 25 positions shown · non-contrast
Comparison: CT abdomen pelvis dated 07/28/2020.

CLINICAL DATA: 38-year-old male with acute renal insufficiency.

EXAM:
RENAL / URINARY TRACT ULTRASOUND COMPLETE

[Series 1: us renal · 14 of 42 slices shown]
[im 1/42]
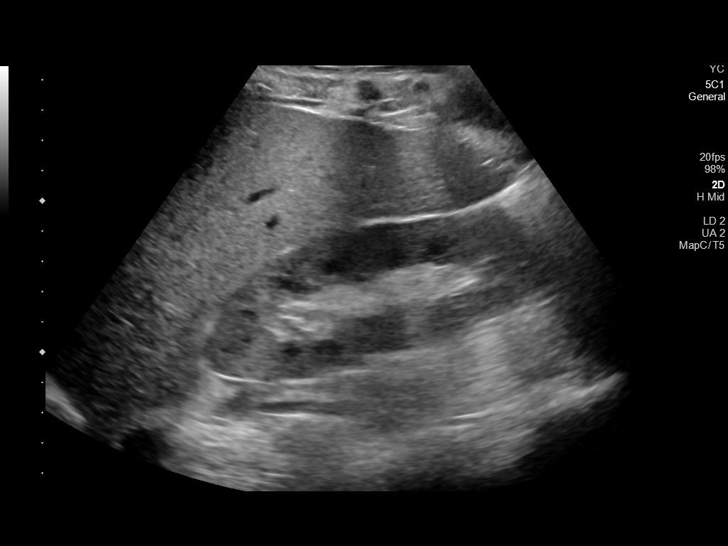
[im 4/42]
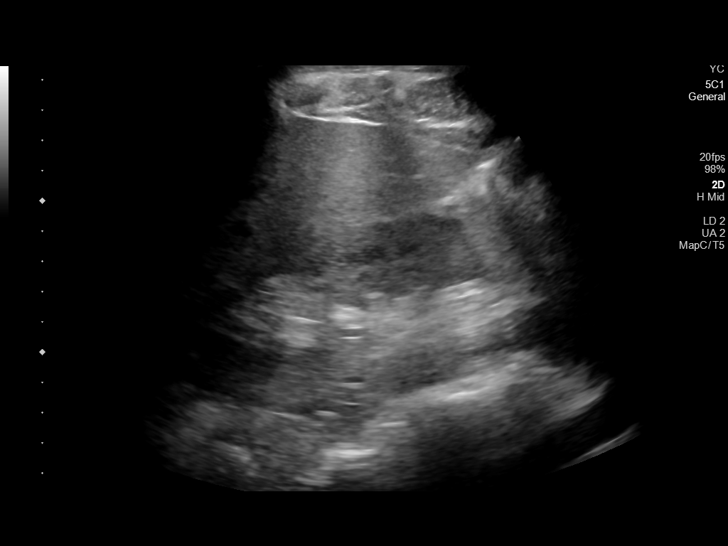
[im 7/42]
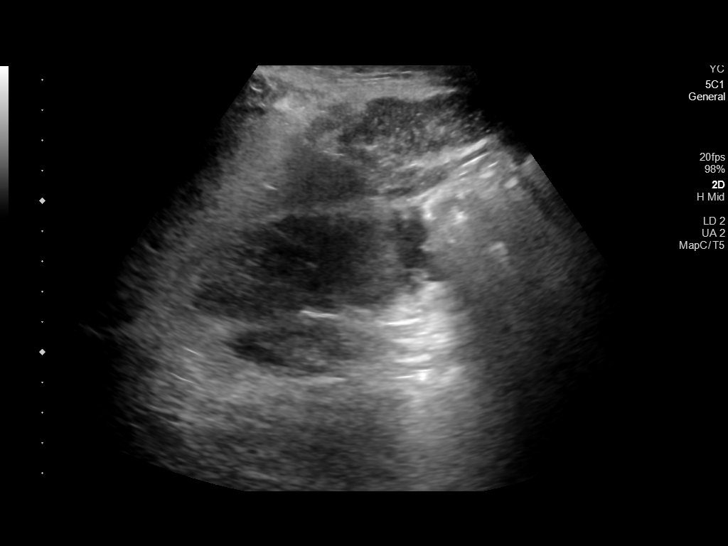
[im 11/42]
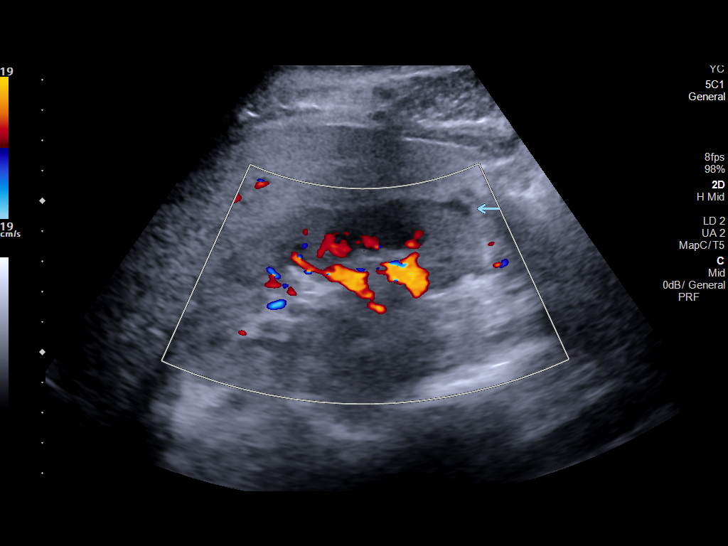
[im 14/42]
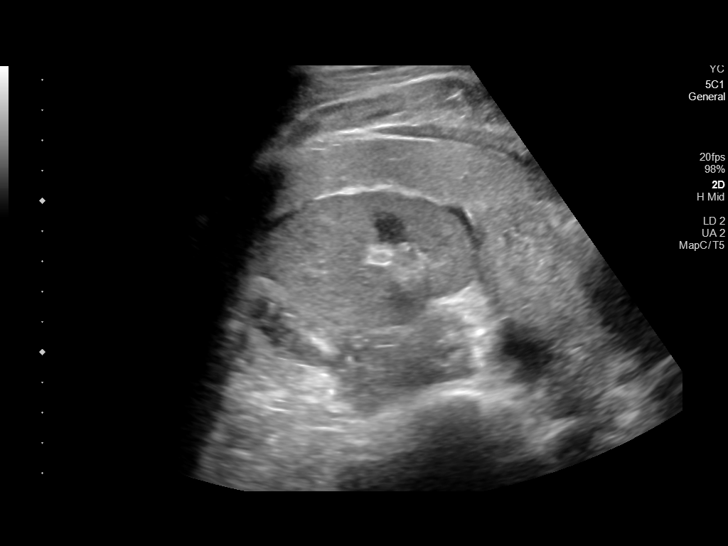
[im 16/42]
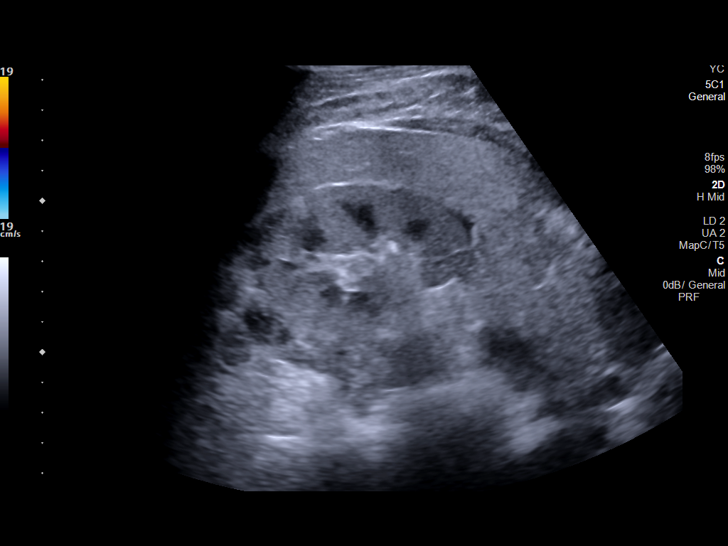
[im 19/42]
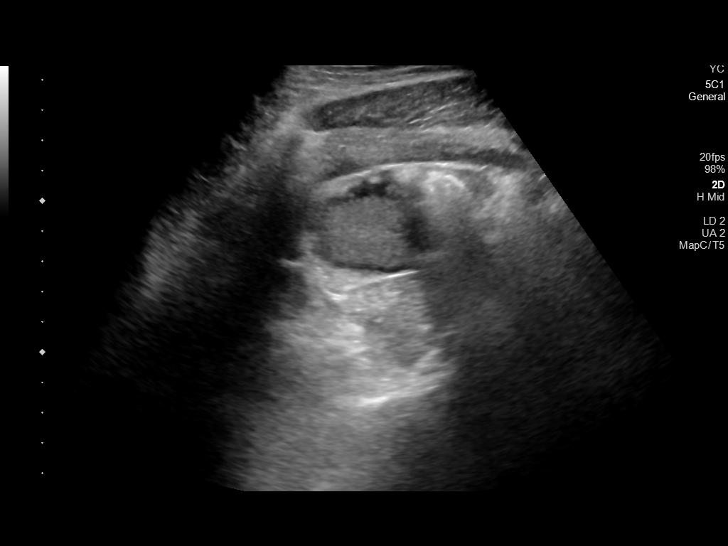
[im 23/42]
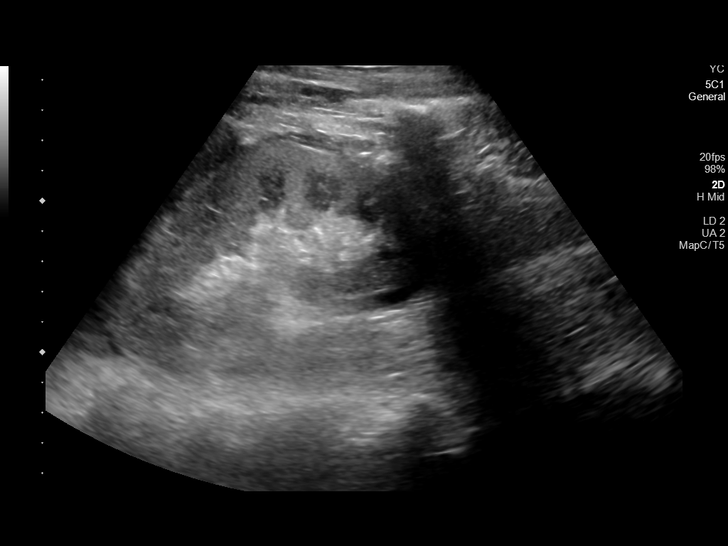
[im 26/42]
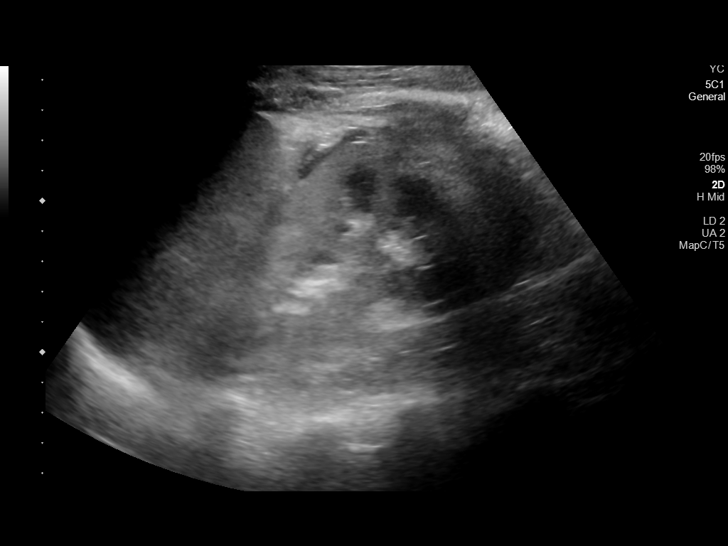
[im 28/42]
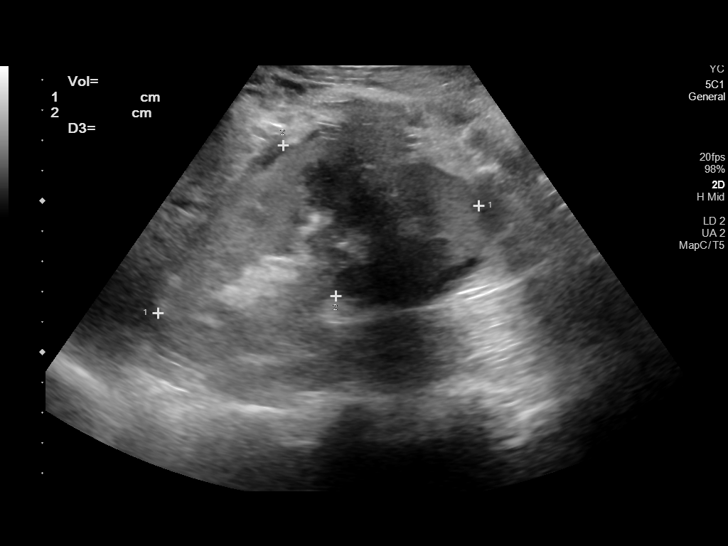
[im 31/42]
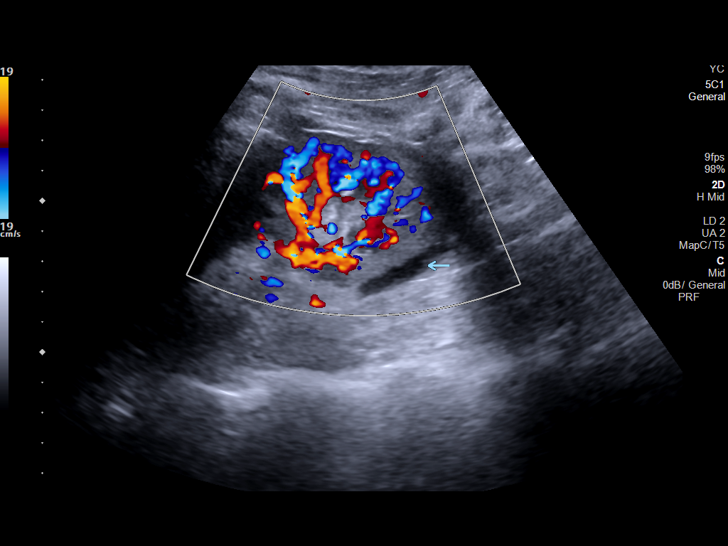
[im 35/42]
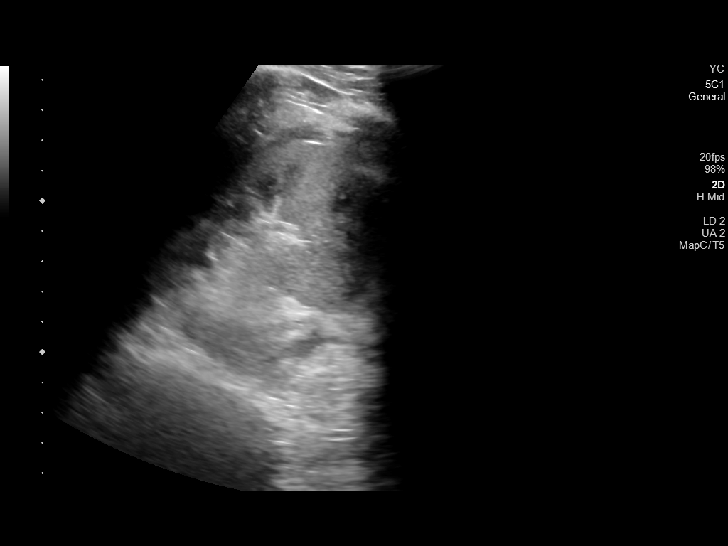
[im 38/42]
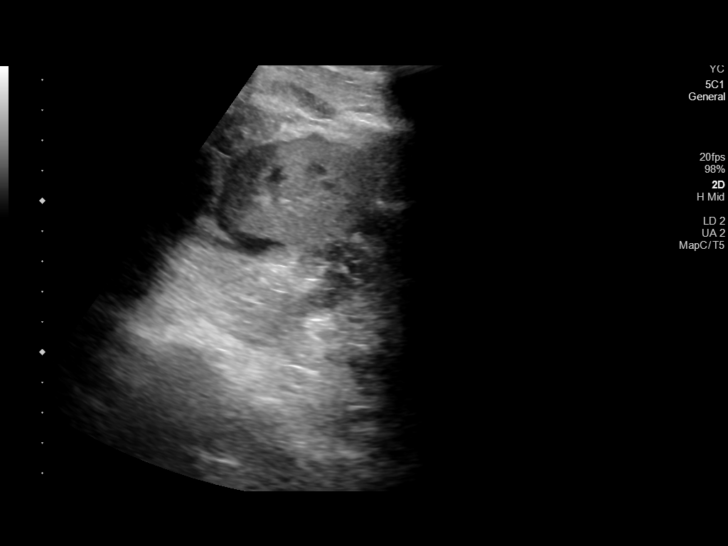
[im 42/42]
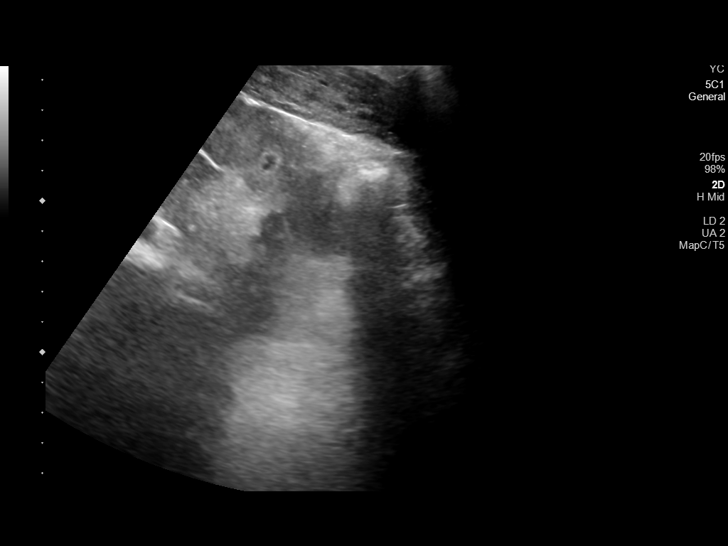

[14 of 25 positions shown; findings below may reference images not displayed]

FINDINGS: Right Kidney:

Renal measurements: 12.4 x 4.5 x 5.5 cm = volume: 155 mL. There is
diffuse increased renal parenchymal echogenicity. No hydronephrosis
or shadowing stone. Trace perinephric free fluid.

Left Kidney:

Renal measurements: 11.2 x 5.3 x 5.9 cm = volume: 183 mL. Diffuse
increased renal parenchyma echogenicity. No hydronephrosis or
shadowing stone. Trace perinephric fluid.

Bladder:

The urinary bladder is collapsed and not visualized.

Other:

There is diffuse increased liver echogenicity most commonly seen in
the setting of fatty infiltration. Superimposed inflammation or
fibrosis is not excluded. Clinical correlation is recommended.
IMPRESSION: 1. Echogenic kidneys likely related to underlying medical renal
disease. No hydronephrosis or shadowing stone.
2. Fatty liver.

## 2020-11-16 IMAGING — US US ABDOMEN LIMITED
1 series · 7 of 7 positions shown · non-contrast
Comparison: Right upper quadrant ultrasound 07/28/2020

CLINICAL DATA: Evaluate for ascites.

EXAM:
LIMITED ABDOMEN ULTRASOUND FOR ASCITES
TECHNIQUE: Limited ultrasound survey for ascites was performed in all four
abdominal quadrants.

[Series 1: us ascites (abdomen limited) · 7 of 7 slices shown]
[im 1/7]
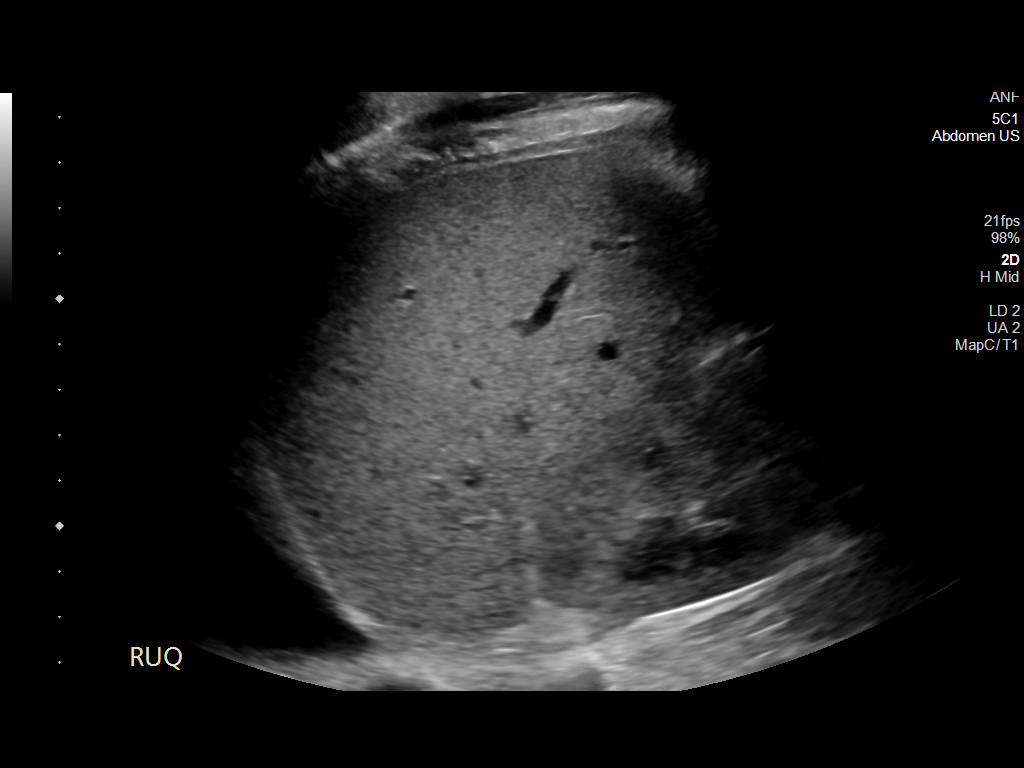
[im 2/7]
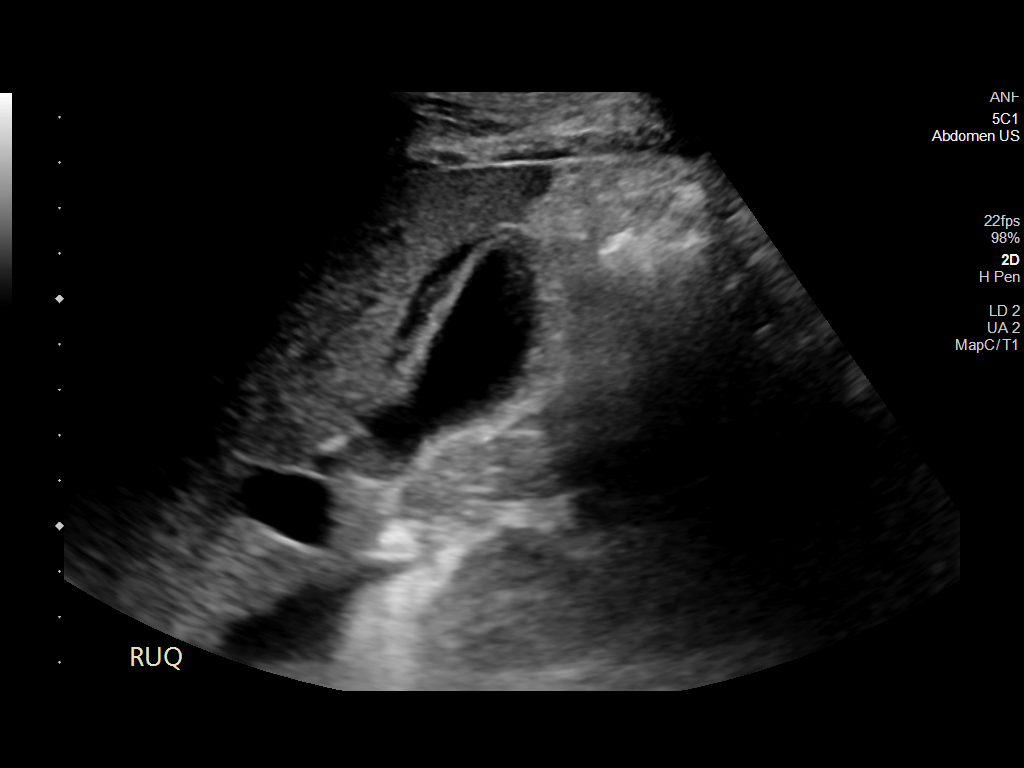
[im 3/7]
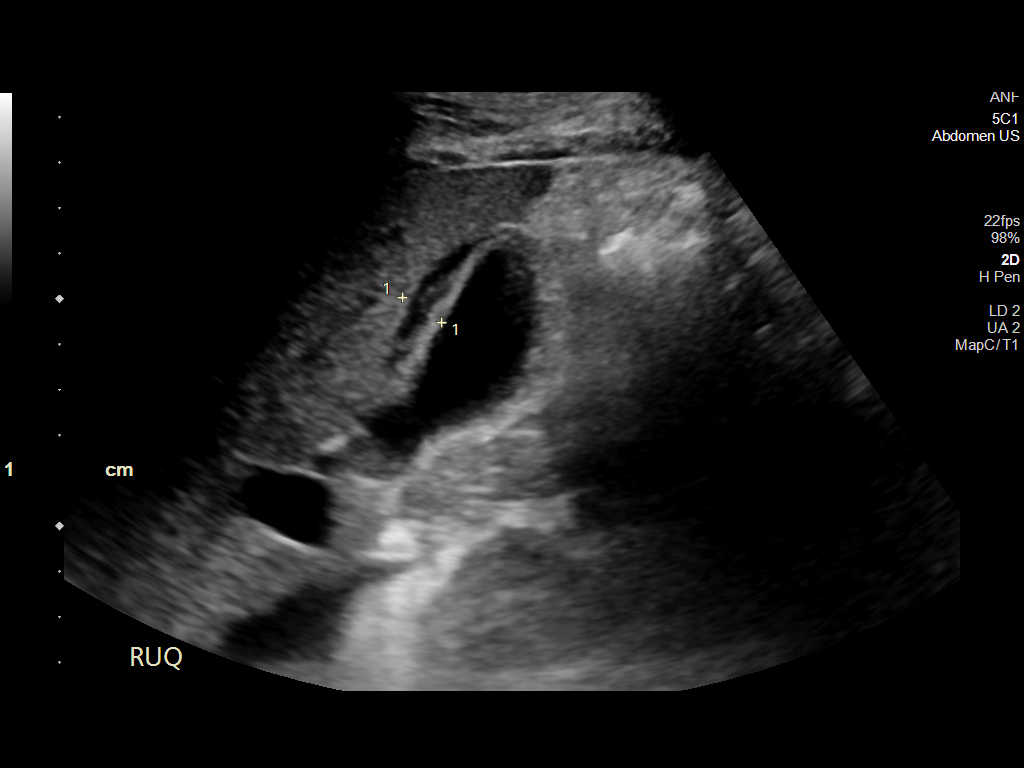
[im 4/7]
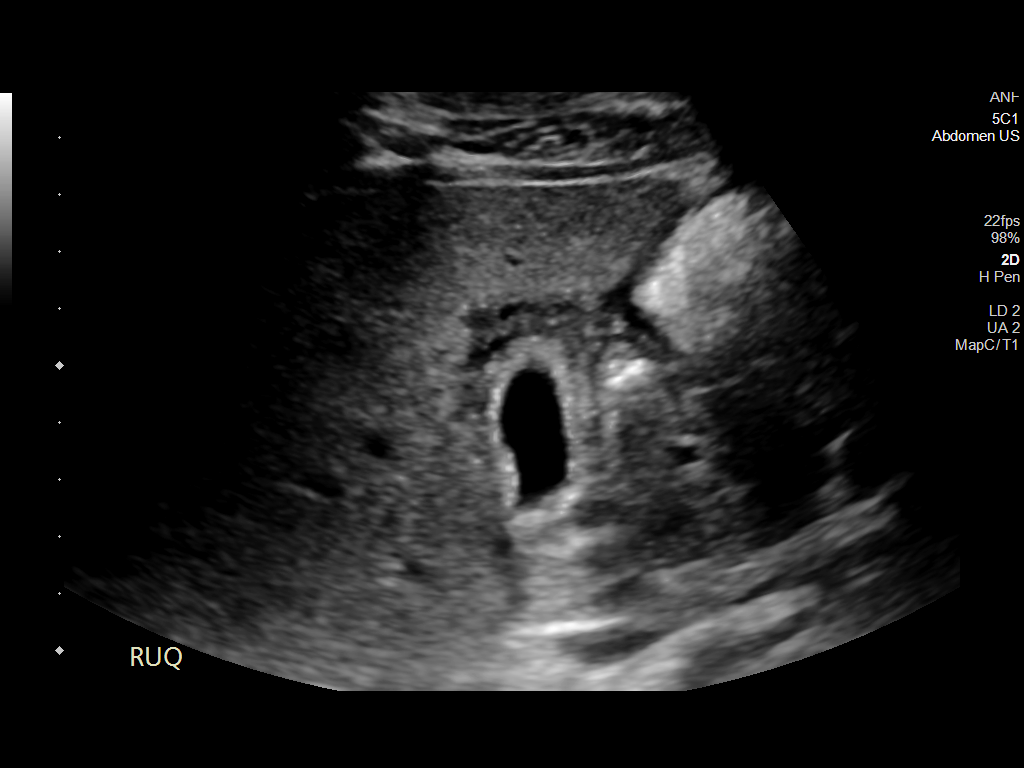
[im 5/7]
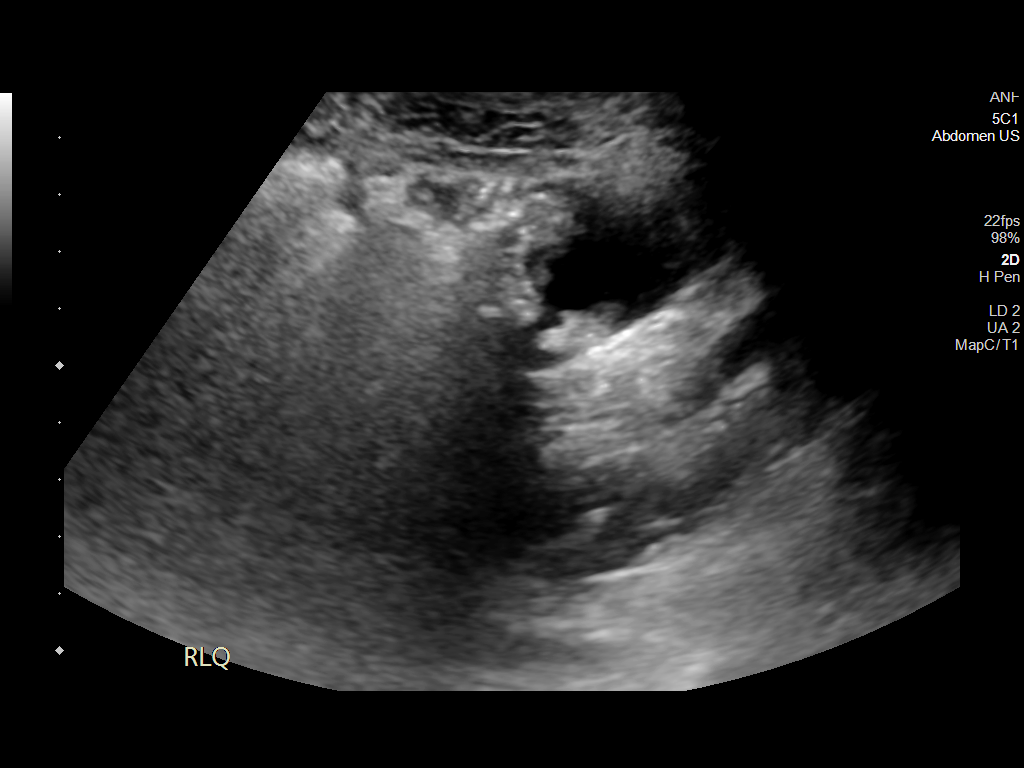
[im 6/7]
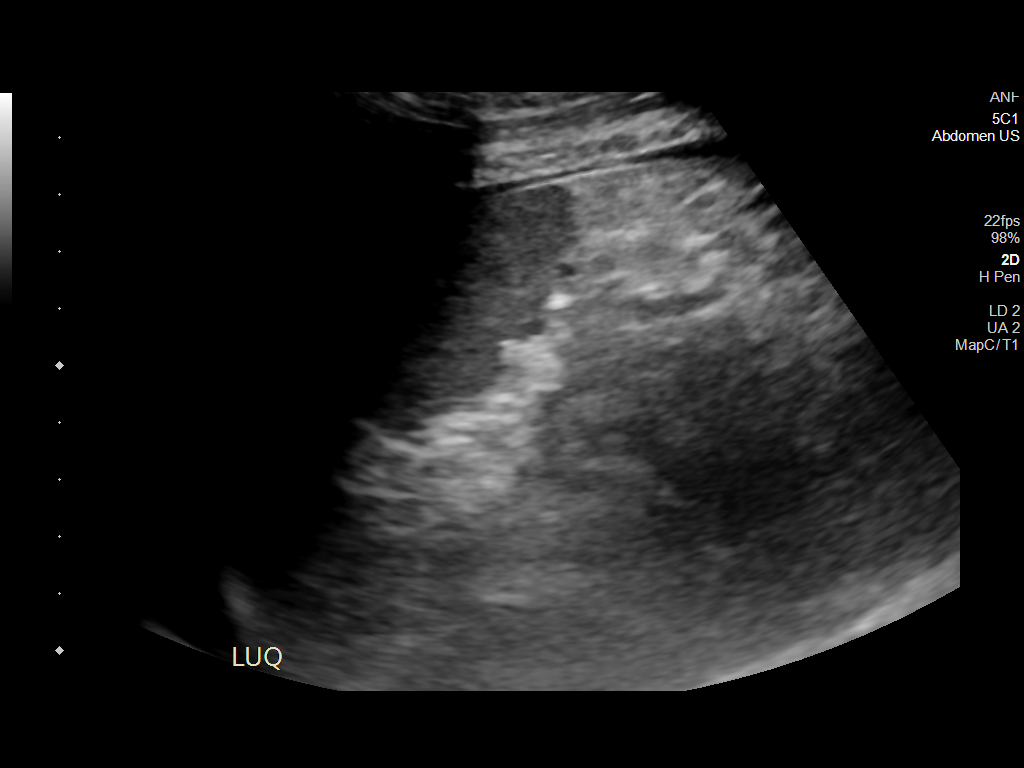
[im 7/7]
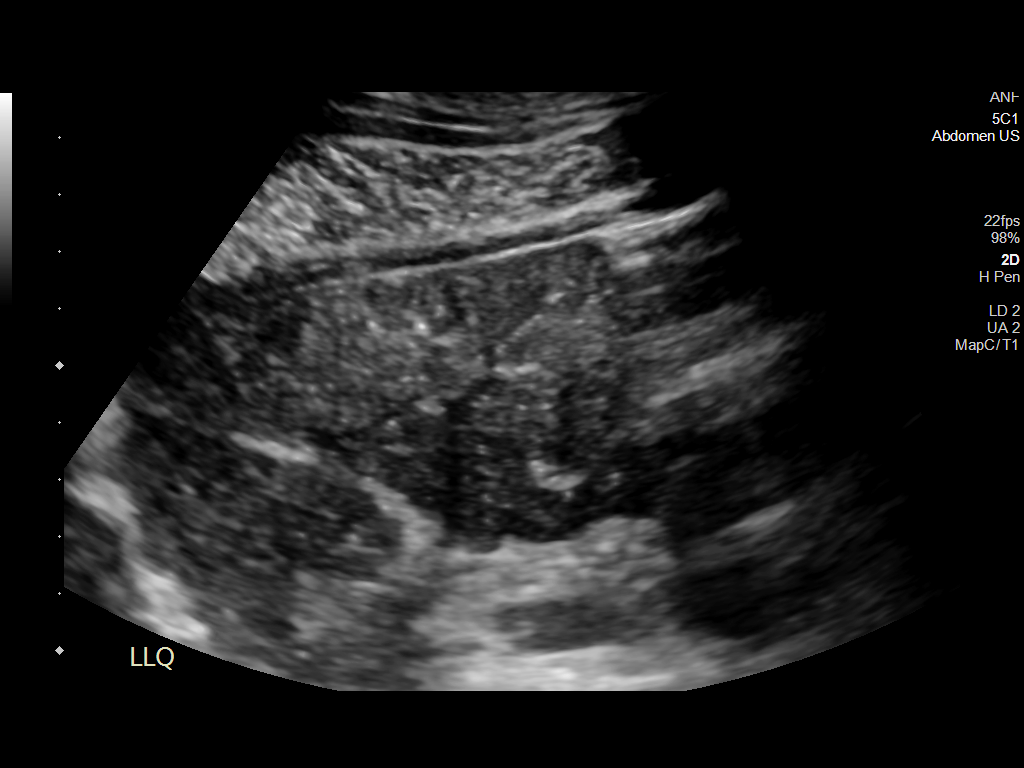

[7 of 7 positions shown; findings below may reference images not displayed]

FINDINGS: Minimal ascites in the right lower quadrant. Stable changes of small
amount of pericholecystic fluid versus edema of the gallbladder wall
as the wall in fluid together measure 1 cm unchanged. No evidence of
cholelithiasis.

Right pleural effusion.
IMPRESSION: 1.  Minimal ascites right lower quadrant.

2. Stable nonspecific edematous gallbladder wall versus
pericholecystic fluid.

## 2022-04-08 ENCOUNTER — Encounter: Payer: Self-pay | Admitting: Emergency Medicine

## 2022-04-08 ENCOUNTER — Other Ambulatory Visit: Payer: Self-pay

## 2022-04-08 ENCOUNTER — Emergency Department
Admission: EM | Admit: 2022-04-08 | Discharge: 2022-04-08 | Disposition: A | Discharge status: Home / Self Care | Payer: Self-pay | Attending: Emergency Medicine | Admitting: Emergency Medicine

## 2022-04-08 DIAGNOSIS — Y93G3 Activity, cooking and baking: Secondary | ICD-10-CM | POA: Insufficient documentation

## 2022-04-08 DIAGNOSIS — W260XXA Contact with knife, initial encounter: Secondary | ICD-10-CM | POA: Insufficient documentation

## 2022-04-08 DIAGNOSIS — S61011A Laceration without foreign body of right thumb without damage to nail, initial encounter: Secondary | ICD-10-CM | POA: Insufficient documentation

## 2022-04-08 NOTE — Discharge Instructions (Addendum)
-  Take Tylenol/ibuprofen as needed for pain.  Keep the wound covered at all times ?-Avoid using oils, lotions, or emollients on the Dermabond solution as it will dissolve it. ?-The Dermabond will dissolve on its own within 5 to 7 days.  No further follow-up needed ?-Return to the emergency department anytime if you begin to experience any new or worsening symptoms. ?

## 2022-04-08 NOTE — ED Triage Notes (Signed)
Pt to ED from home c/o right thumb laceration with filet knife at home.  Bandaid in place.   ?

## 2022-04-08 NOTE — ED Notes (Signed)
Dc instructions reviewed with pt no questions or concerns at this time.  

## 2022-04-08 NOTE — ED Provider Notes (Signed)
? ?[facility] ?Provider Note ? ? ? Event Date/Time  ? First MD Initiated Contact with Patient 04/08/22 2028   ?  (approximate) ? ? ?History  ? ?Chief Complaint ?Laceration ? ? ?HPI ?Paul Reese is a 41 y.o. male, no remarkable medical history, presents to the emergency department for evaluation of laceration.  Patient states that he was cooking when he accidentally cut his right thumb with a knife.  Bleeding was controlled direct pressure.  Denies any numbness/tingling to the affected digit or decreased range of motion.  Denies any other injuries he is up-to-date on his tetanus. ? ?History Limitations: No limitations ? ?    ? ? ?Physical Exam  ?Triage Vital Signs: ?ED Triage Vitals  ?Enc Vitals Group  ?   BP 04/08/22 2025 (!) 137/103  ?   Pulse Rate 04/08/22 2025 88  ?   Resp 04/08/22 2025 14  ?   Temp 04/08/22 2025 98.4 ?F (36.9 ?C)  ?   Temp Source 04/08/22 2025 Oral  ?   SpO2 04/08/22 2025 92 %  ?   Weight 04/08/22 2022 185 lb (83.9 kg)  ?   Height 04/08/22 2022 6\' 1"  (1.854 m)  ?   Head Circumference --   ?   Peak Flow --   ?   Pain Score 04/08/22 2022 8  ?   Pain Loc --   ?   Pain Edu? --   ?   Excl. in GC? --   ? ? ?Most recent vital signs: ?Vitals:  ? 04/08/22 2025  ?BP: (!) 137/103  ?Pulse: 88  ?Resp: 14  ?Temp: 98.4 ?F (36.9 ?C)  ?SpO2: 92%  ? ? ?General: Awake, NAD.  ?Skin: Warm, dry. No rashes or lesions.  ?Eyes: PERRL. Conjunctivae normal.  ?CV: Good peripheral perfusion.  ?Resp: Normal effort.  ?Abd: Soft, non-tender. No distention.  ?Neuro: At baseline. No gross neurological deficits.  ? ?Focused Exam: 1.5 cm linear laceration along the pad of the right thumb.  No active bleeding or discharge.  Normal range of motion of the affected digit, including flexion and extension.  Sensation intact.  No bony tenderness.  No foreign bodies appreciated. ? ?Physical Exam ? ? ? ?ED Results / Procedures / Treatments  ?Labs ?(all labs ordered are listed, but only abnormal results are  displayed) ?Labs Reviewed - No data to display ? ? ?EKG ?Not applicable. ? ? ?RADIOLOGY ? ?ED Provider Interpretation: Not applicable. ? ?No results found. ? ?PROCEDURES: ? ?Critical Care performed: Not applicable. ? ?..Laceration Repair ? ?Date/Time: 04/08/2022 9:03 PM ?Performed by: 04/10/2022, PA ?Authorized by: Varney Daily, PA  ? ?Consent:  ?  Consent obtained:  Verbal ?  Consent given by:  Patient ?  Risks discussed:  Pain, poor cosmetic result, tendon damage and infection ?  Alternatives discussed:  No treatment ?Universal protocol:  ?  Patient identity confirmed:  Verbally with patient ?Anesthesia:  ?  Anesthesia method:  None ?Laceration details:  ?  Location:  Finger ?  Finger location:  R thumb ?  Length (cm):  1.5 ?  Depth (mm):  1 ?Exploration:  ?  Wound extent: no foreign bodies/material noted, no nerve damage noted, no tendon damage noted, no underlying fracture noted and no vascular damage noted   ?Treatment:  ?  Area cleansed with:  Soap and water ?  Amount of cleaning:  Extensive ?  Irrigation solution:  Tap water ?  Irrigation volume:  3  liters ?  Irrigation method:  Tap ?Skin repair:  ?  Repair method:  Tissue adhesive ?Approximation:  ?  Approximation:  Close ?Post-procedure details:  ?  Dressing:  Sterile dressing and tube gauze ?  Procedure completion:  Tolerated well, no immediate complications ? ? ? ?MEDICATIONS ORDERED IN ED: ?Medications - No data to display ? ? ?IMPRESSION / MDM / ASSESSMENT AND PLAN / ED COURSE  ?I reviewed the triage vital signs and the nursing notes. ?             ?               ? ?Differential diagnosis includes, but is not limited to, laceration, tendon injury, phalangeal fracture. ? ?ED Course ?Patient appears well, vitals are within normal limits.  NAD. ? ?Assessment/Plan ?Patient presents with 1.5 cm laceration to the right thumb.  Wound was cleansed extensively utilizing soap and water under tap.  Wound edges were easily approximated.  Repaired  the laceration utilizing Dermabond solution.  Patient tolerated the procedure well with no immediate complications.  No other injuries present we will plan to discharge this patient. ? ?Provided the patient with anticipatory guidance, return precautions, and educational material. Encouraged the patient to return to the emergency department at any time if they begin to experience any new or worsening symptoms. Patient expressed understanding and agreed with the plan.  ? ?  ? ? ?FINAL CLINICAL IMPRESSION(S) / ED DIAGNOSES  ? ?Final diagnoses:  ?Laceration of right thumb without foreign body without damage to nail, initial encounter  ? ? ? ?Rx / DC Orders  ? ?ED Discharge Orders   ? ? None  ? ?  ? ? ? ?Note:  This document was prepared using Dragon voice recognition software and may include unintentional dictation errors. ?  ?Varney Daily, [place] ?04/08/22 2107 ? ?  ?Jene Every, MD ?04/10/22 [phone] ? ?

## 2024-07-11 DIAGNOSIS — J029 Acute pharyngitis, unspecified: Secondary | ICD-10-CM | POA: Diagnosis not present

## 2024-07-11 DIAGNOSIS — J020 Streptococcal pharyngitis: Secondary | ICD-10-CM | POA: Diagnosis not present

## 2024-07-11 DIAGNOSIS — Z113 Encounter for screening for infections with a predominantly sexual mode of transmission: Secondary | ICD-10-CM | POA: Diagnosis not present

## 2024-07-11 DIAGNOSIS — R369 Urethral discharge, unspecified: Secondary | ICD-10-CM | POA: Diagnosis not present
